# Patient Record
Sex: Male | Born: 1975 | Race: White | Hispanic: No | Marital: Married | Smoking: Never smoker
Health system: Southern US, Community
[De-identification: ages and names within clinical notes are randomized; demographics above are authoritative.]

---

## 2005-12-23 ENCOUNTER — Other Ambulatory Visit: Payer: Self-pay

## 2005-12-23 ENCOUNTER — Inpatient Hospital Stay: Payer: Self-pay | Admitting: Internal Medicine

## 2005-12-26 ENCOUNTER — Emergency Department (HOSPITAL_COMMUNITY): Admission: EM | Admit: 2005-12-26 | Discharge: 2005-12-26 | Payer: Self-pay | Admitting: Emergency Medicine

## 2010-10-20 ENCOUNTER — Other Ambulatory Visit: Payer: Self-pay | Admitting: Family Medicine

## 2010-10-20 ENCOUNTER — Ambulatory Visit
Admission: RE | Admit: 2010-10-20 | Discharge: 2010-10-20 | Payer: Self-pay | Source: Home / Self Care | Attending: Family Medicine | Admitting: Family Medicine

## 2010-10-20 DIAGNOSIS — M719 Bursopathy, unspecified: Secondary | ICD-10-CM

## 2010-10-20 DIAGNOSIS — M67919 Unspecified disorder of synovium and tendon, unspecified shoulder: Secondary | ICD-10-CM | POA: Insufficient documentation

## 2010-10-20 DIAGNOSIS — Z8679 Personal history of other diseases of the circulatory system: Secondary | ICD-10-CM | POA: Insufficient documentation

## 2010-10-20 LAB — BASIC METABOLIC PANEL
CO2: 30 mEq/L (ref 19–32)
Calcium: 9.6 mg/dL (ref 8.4–10.5)
Creatinine, Ser: 0.8 mg/dL (ref 0.4–1.5)
Sodium: 138 mEq/L (ref 135–145)

## 2010-10-20 LAB — LDL CHOLESTEROL, DIRECT: Direct LDL: 137.1 mg/dL

## 2010-10-30 NOTE — Assessment & Plan Note (Signed)
Summary: NEW PT TO BE ESTABLISHED/JRR   Vital Signs:  Patient profile:   35 year old male Height:      71 inches Weight:      261.8 pounds BMI:     36.65 Temp:     98.8 degrees F oral Pulse rate:   76 / minute Pulse rhythm:   regular BP sitting:   120 / 80  (left arm) Cuff size:   regular  Vitals Entered By: Benny Lennert CMA Duncan Dull) (October 20, 2010 10:51 AM)  History of Present Illness: Chief complaint new patient to be established also has right shoulder pain  35 year old male:  Right shoulder pain: The patient noted above presents with shoulder pain that has been ongoing for 1-2 months  there is no history of trauma or accident. The patient denies neck pain or radicular symptoms. Denies dislocation, subluxation, separation of the shoulder. The patient does complain of pain in the overhead plane, IROM and EROM  Medications Tried: NSAIDS Tried PT: No, but good knowledge and doing HEP RTC and scap stabilization at home with theraband.  Prior shoulder Injury: left only Prior surgery: No Prior fracture: No  Massage therapist, left therapist, saw Van Clines. Left shoulder about 10 years ago. any movement of the shoulder hurts  left ear is really clogged.   TIA, in 2007. Working two jobs. Had normal MRI.  Was only getting two or three hours a night.    Preventive Screening-Counseling & Management  Alcohol-Tobacco     Smoking Status: never  Caffeine-Diet-Exercise     Does Patient Exercise: yes      Drug Use:  no.    Allergies (verified): No Known Drug Allergies  Past History:  Past Medical History: TIA, 2007  Family History: none  Social History: Occupation:massage therapist Married, 4 children Never Smoked Alcohol use-no Drug use-no Regular exercise-yes Occupation:  employed Smoking Status:  never Drug Use:  no Does Patient Exercise:  yes  Review of Systems       REVIEW OF SYSTEMS  GEN: No systemic complaints, no fevers, chills, sweats,  or other acute illnesses MSK: Detailed in the HPI GI: tolerating PO intake without difficulty Neuro: No numbness, parasthesias, or tingling associated. Otherwise the pertinent positives of the ROS are noted above.    Physical Exam  General:  GEN: Well-developed,well-nourished,in no acute distress; alert,appropriate and cooperative throughout examination HEENT: Normocephalic and atraumatic without obvious abnormalities. No apparent alopecia or balding. Ears, externally no deformities PULM: Breathing comfortably in no respiratory distress EXT: No clubbing, cyanosis, or edema PSYCH: Normally interactive. Cooperative during the interview. Pleasant. Friendly and conversant. Not anxious or depressed appearing. Normal, full affect.  Msk:  Shoulder: R Inspection: No muscle wasting or winging Ecchymosis/edema: neg  ROM IROM restricted with minimal IROM at 90 deg abduction on the R compared to L with normal IROM AC joint, scapula, clavicle: NT Cervical spine: NT, full ROM Spurling's: neg Abduction: full, 5/5 Flexion: full, 5/5 IR, full, lift-off: 5/5 ER at neutral: full, 5/5 AC crossover: neg Neer: pos Hawkins: pos Drop Test: neg Empty Can: pos Supraspinatus insertion: mild-mod T Bicipital groove: NT Speed's: neg Yergason's: neg Sulcus sign: neg Scapular dyskinesis: none C5-T1 intact  Neuro: Sensation intact Grip 5/5    Impression & Recommendations:  Problem # 1:  ROTATOR CUFF SYNDROME, RIGHT (ICD-726.10) Assessment New  Shoulder anatomy was reviewed with the patient using and anatomical model.  GIRD: work on [employer], Press photographer cuff strengthening and scapular  stabilization exercises were reviewed with the patient.  A handout was given based on a shoulder program from [facility].  Retraining shoulder mechanics and function was emphasized to the patient with rehab done at least 5-6 days a week.  The patient could benefit from formal PT to assist with  scapular stabilization and RTC strengthening.   Orders: Joint Aspirate / Injection, Large (20610) Kenalog 10mg  (4units) (J3301)  Problem # 2:  TRANSIENT ISCHEMIC ATTACKS, HX OF (ICD-V12.50) Assessment: New lifelong ASA check lipids  Problem # 3:  SCREENING FOR LIPOID DISORDERS (ICD-V77.91)  Orders: Venipuncture (04540) TLB-Cholesterol, Direct LDL (83721-DIRLDL)  Problem # 4:  SCREENING, DIABETES MELLITUS (ICD-V77.1)  Orders: TLB-BMP (Basic Metabolic Panel-BMET) (80048-METABOL)  Complete Medication List: 1)  Baby Aspirin 81 Mg Chew (Aspirin) .... One tablet daily   Orders Added: 1)  Venipuncture [36415] 2)  TLB-Cholesterol, Direct LDL [83721-DIRLDL] 3)  TLB-BMP (Basic Metabolic Panel-BMET) [80048-METABOL] 4)  New Patient Level III [98119] 5)  Joint Aspirate / Injection, Large [20610] 6)  Kenalog 10mg  (4units) [J3301]    Current Allergies (reviewed today): No known allergies

## 2011-03-29 ENCOUNTER — Ambulatory Visit (INDEPENDENT_AMBULATORY_CARE_PROVIDER_SITE_OTHER): Payer: Self-pay

## 2011-03-29 ENCOUNTER — Inpatient Hospital Stay (INDEPENDENT_AMBULATORY_CARE_PROVIDER_SITE_OTHER)
Admission: RE | Admit: 2011-03-29 | Discharge: 2011-03-29 | Disposition: A | Discharge status: Home / Self Care | Payer: Self-pay | Source: Ambulatory Visit | Attending: Emergency Medicine | Admitting: Emergency Medicine

## 2011-03-29 DIAGNOSIS — S335XXA Sprain of ligaments of lumbar spine, initial encounter: Secondary | ICD-10-CM

## 2011-03-29 LAB — POCT URINALYSIS DIP (DEVICE)
Bilirubin Urine: NEGATIVE
Glucose, UA: NEGATIVE mg/dL
Ketones, ur: NEGATIVE mg/dL
Leukocytes, UA: NEGATIVE
Nitrite: NEGATIVE
pH: 5 (ref 5.0–8.0)

## 2012-04-15 ENCOUNTER — Telehealth: Payer: Self-pay | Admitting: Family Medicine

## 2012-04-15 NOTE — Telephone Encounter (Signed)
Triage Record Num: [id number] Operator: Griselda Miner Patient Name: John Valenzuela Call Date & Time: 04/14/2012 8:58:19PM Patient Phone: [phone] PCP: Hannah Beat Patient Gender: Male PCP Fax : [phone] Patient DOB: 1976/01/01 Practice Name: [facility] Reason for Call: Caller: Nicoles/Patient; PCP: Juleen China.; CB#: [phone]; Call regarding Sore Throat; Onset today and has fever of 102 orally 20:30-has taken fever reducer and does bring time down; Is able to eat and drink; Children were dx strep throat on Monday 7/14 and 7/16; Triaged using Sore Throat or Hoarseness with a disposition to see provider within 24 hrs r/t temperature of 101.5 or greater. Offered to triage wife who was having the same symptoms but declined stating she will need to be seen as well. limited care advice given. Pt had hoped for Abx to be called in. RN explained the need for evaluations r/t throat. Pt demonstrated understanding. Protocol(s) Used: Sore Throat or Hoarseness Recommended Outcome per Protocol: See Provider within 24 hours Reason for Outcome: Temperature of 101.5 F(38.6 C) or greater Care Advice: Analgesic/Antipyretic Advice - Acetaminophen: Consider acetaminophen as directed on label or by pharmacist/provider for pain or fever PRECAUTIONS: - Use if there is no history of liver disease, alcoholism, or intake of three or more alcohol drinks per day - Only if approved by provider during pregnancy or when breastfeeding - During pregnancy, acetaminophen should not be taken more than 3 consecutive days without telling provider - Do not exceed recommended dose or frequency ~ Analgesic/Antipyretic Advice - NSAIDs: Consider aspirin, ibuprofen, naproxen or ketoprofen for pain or fever as directed on label or by pharmacist/provider. PRECAUTIONS: - If over 66 years of age, should not take longer than 1 week without consulting provider. EXCEPTIONS: - Should not be used if taking  blood thinners or have bleeding problems. - Do not use if have history of sensitivity/allergy to any of these medications; or history of cardiovascular, ulcer, kidney, liver disease or diabetes unless approved by provider. - Do not exceed recommended dose or frequency. ~ 04/14/2012 9:09:16PM Page 1 of 1 [email or web address]

## 2013-07-12 ENCOUNTER — Emergency Department (INDEPENDENT_AMBULATORY_CARE_PROVIDER_SITE_OTHER)
Admission: EM | Admit: 2013-07-12 | Discharge: 2013-07-12 | Disposition: A | Discharge status: Home / Self Care | Payer: Self-pay | Source: Home / Self Care | Attending: Family Medicine | Admitting: Family Medicine

## 2013-07-12 ENCOUNTER — Encounter (HOSPITAL_COMMUNITY): Payer: Self-pay | Admitting: Emergency Medicine

## 2013-07-12 DIAGNOSIS — M545 Low back pain: Secondary | ICD-10-CM

## 2013-07-12 MED ORDER — HYDROCODONE-ACETAMINOPHEN 5-325 MG PO TABS: 1.0000 | ORAL_TABLET | Freq: Four times a day (QID) | ORAL | Status: DC | PRN

## 2013-07-12 MED ORDER — KETOROLAC TROMETHAMINE 60 MG/2ML IM SOLN
60.0000 mg | Freq: Once | INTRAMUSCULAR | Status: AC
  Administered 2013-07-12: 60 mg via INTRAMUSCULAR

## 2013-07-12 MED ORDER — MELOXICAM 15 MG PO TABS: 15.0000 mg | ORAL_TABLET | Freq: Every day | ORAL | Status: DC | PRN

## 2013-07-12 MED ORDER — METHYLPREDNISOLONE ACETATE 80 MG/ML IJ SUSP
INTRAMUSCULAR | Status: AC
  Filled 2013-07-12: qty 1

## 2013-07-12 MED ORDER — METHOCARBAMOL 500 MG PO TABS: 500.0000 mg | ORAL_TABLET | Freq: Four times a day (QID) | ORAL | Status: DC | PRN

## 2013-07-12 MED ORDER — KETOROLAC TROMETHAMINE 60 MG/2ML IM SOLN
INTRAMUSCULAR | Status: AC
  Filled 2013-07-12: qty 2

## 2013-07-12 MED ORDER — METHYLPREDNISOLONE ACETATE 80 MG/ML IJ SUSP
80.0000 mg | Freq: Once | INTRAMUSCULAR | Status: AC
  Administered 2013-07-12: 80 mg via INTRAMUSCULAR

## 2013-07-12 NOTE — ED Notes (Signed)
Recurrent pain right lower back ; had been told by another provider that there is a defect in his spine; denies trauma or saddle anesthesia or loss of sensation in his leg

## 2013-07-12 NOTE — ED Notes (Signed)
Discussed medication compliance and precautions

## 2013-07-12 NOTE — ED Provider Notes (Signed)
John Valenzuela is a 37 y.o. male who presents to Urgent Care today for bilateral low back pain present for several days. No radiating pain weakness numbness bowel bladder dysfunction difficulty walking fevers or chills. No medications tried. He feels well otherwise. He has had several episodes of low back pain similar to this the past. They have all resolved with oral medications after several days.    History reviewed. No pertinent past medical history. History  Substance Use Topics  . Smoking status: Never Smoker   . Smokeless tobacco: Not on file  . Alcohol Use: No   ROS as above Medications reviewed. Current Facility-Administered Medications  Medication Dose Route Frequency Provider Last Rate Last Dose  . ketorolac (TORADOL) injection 60 mg  60 mg Intramuscular Once Rodolph Bong, MD      . methylPREDNISolone acetate (DEPO-MEDROL) injection 80 mg  80 mg Intramuscular Once Rodolph Bong, MD       Current Outpatient Prescriptions  Medication Sig Dispense Refill  . HYDROcodone-acetaminophen (NORCO/VICODIN) 5-325 MG per tablet Take 1 tablet by mouth every 6 (six) hours as needed for pain.  15 tablet  0  . meloxicam (MOBIC) 15 MG tablet Take 1 tablet (15 mg total) by mouth daily as needed for pain.  30 tablet  0  . methocarbamol (ROBAXIN) 500 MG tablet Take 1 tablet (500 mg total) by mouth 4 (four) times daily as needed (spasm).  30 tablet  0    Exam:  BP 143/88  Pulse 91  Temp(Src) 97.9 F (36.6 C) (Oral)  Resp 18  SpO2 95% Gen: Well NAD BACK: Nontender to spinal midline. Tender palpation bilateral SI joints. Negative straight leg raise test and Faber test bilaterally. Range of motion is decreased with flexion, normal extension and rotation Strength sensation are intact bilateral lower extremity Patient is able to stand on heels toes squat and get on and off exam table by himself Reflexes are intact and equal bilateral knees and ankle Capillary refill is intact distal bilateral lower  extremities.   No results found for this or any previous visit (from the past 24 hour(s)). No results found.  Assessment and Plan: 37 y.o. male with lumbago Plan to treat with meloxicam, Robaxin, Norco Followup with Dr. Katrinka Blazing and Dr. Dallas Schimke. Discussed warning signs or symptoms. Please see discharge instructions. Patient expresses understanding.      Rodolph Bong, MD 07/12/13 1020

## 2017-11-23 ENCOUNTER — Encounter: Payer: Self-pay | Admitting: Emergency Medicine

## 2017-11-23 ENCOUNTER — Ambulatory Visit: Payer: Self-pay | Admitting: Emergency Medicine

## 2017-11-23 VITALS — BP 120/90 | HR 97 | Temp 98.6°F | Wt 272.2 lb

## 2017-11-23 DIAGNOSIS — J0100 Acute maxillary sinusitis, unspecified: Secondary | ICD-10-CM

## 2017-11-23 MED ORDER — AMOXICILLIN-POT CLAVULANATE 875-125 MG PO TABS: 1.0000 | ORAL_TABLET | Freq: Two times a day (BID) | ORAL | 0 refills | Status: DC

## 2017-11-23 MED ORDER — PREDNISONE 50 MG PO TABS: ORAL_TABLET | ORAL | 0 refills | Status: DC

## 2017-11-23 NOTE — Progress Notes (Signed)
Subjective:     John Valenzuela is a 42 y.o. male who presents for evaluation of sinus pain. Symptoms include: congestion, facial pain, post nasal drip and snoring. Onset of symptoms was 10 days ago. Symptoms have been unchanged since that time. Past history is significant for influenza 2 weeks ago. Patient is a non-smoker.   Review of Systems Pertinent items noted in HPI and remainder of comprehensive ROS otherwise negative.   Objective:    BP 120/90   Pulse 97   Temp 98.6 F (37 C)   Wt 272 lb 3.2 oz (123.5 kg)   SpO2 96%   BMI 37.96 kg/m  General appearance: alert, cooperative and appears stated age Head: Normocephalic, without obvious abnormality, atraumatic Ears: normal TM's and external ear canals both ears Nose: sinus tenderness bilateral Throat: lips, mucosa, and tongue normal; teeth and gums normal Lungs: clear to auscultation bilaterally Heart: regular rate and rhythm Pulses: 2+ and symmetric    Assessment:    Acute bacterial sinusitis.    Plan:    Nasal saline sprays. Augmentin per medication orders. Follow up in 1 week or as needed.   D/C OTC Afrin

## 2017-11-23 NOTE — Patient Instructions (Signed)

## 2018-06-13 ENCOUNTER — Ambulatory Visit: Payer: Self-pay | Admitting: Physician Assistant

## 2018-06-13 VITALS — BP 122/82 | HR 79 | Temp 98.2°F | Resp 20 | Wt 250.4 lb

## 2018-06-13 DIAGNOSIS — M778 Other enthesopathies, not elsewhere classified: Secondary | ICD-10-CM

## 2018-06-13 DIAGNOSIS — M779 Enthesopathy, unspecified: Secondary | ICD-10-CM

## 2018-06-13 MED ORDER — PREDNISONE 10 MG (21) PO TBPK: ORAL_TABLET | Freq: Every day | ORAL | 0 refills | Status: AC

## 2018-06-13 NOTE — Patient Instructions (Signed)
Tenosynovitis Suspected diagnosis of tendonitis of right hand. Take prednisone taper as prescribed. 60mg  on first day, 50mg  on second day, 40mg  on third day, 30mg  on fourth day, 20mg  on fifth day, and 10mg  of sixth day. Take with food to prevent stomach upset. May use over the counter Pepcid or Zantac if you develop stomach upset. May apply ice and/or heat. May use over the counter [place], IcyHot, or BioFreeze. If limited improvement in right hand pain within the next week, recommend patient follow-up with orthopedist. Such as [facility]. [address], [place], [place] [address]. [phone].  Tenosynovitis is inflammation of a tendon and the sleeve of tissue that covers the tendon (tendon sheath). A tendon is a cord of tissue that connects muscle to bone. Normally, a tendon slides smoothly inside its tendon sheath. Tenosynovitis limits movement of the tendon and surrounding tissues, which may cause pain and stiffness. Tenosynovitis can affect any tendon and tendon sheath. Commonly affected areas include tendons in the:  Shoulder.  Arm.  Hand.  Hip.  Leg.  Foot.  What are the causes? The main cause of this condition is wear and tear over time that results in slight tears in the tendon. Other possible causes include:  A sudden injury to the tendon or tendon sheath.  A disease that causes inflammation in the body.  An infection that spreads to the tendon and tendon sheath from a skin wound.  An infection in another part of the body that spreads to the tendon and tendon sheath through the blood.  What increases the risk? The following factors may make you more likely to develop this condition:  Having rheumatoid arthritis, gout, or diabetes.  Using IV drugs.  Doing physical activities that can cause tendon overuse and stress.  Having gonorrhea.  What are the signs or symptoms? Symptoms of this condition depend on the cause. Symptoms may include:  Pain  with movement.  Pain and tenderness when pressing on the tendon and tendon sheath.  Swelling.  Stiffness.  If tenosynovitis is caused by an infection, symptoms may also include:  Fever.  Redness.  Warmth.  How is this diagnosed? This condition may be diagnosed based on your medical history and a physical exam. You also may have:  Blood tests.  Imaging tests, such as: ? MRI. ? Ultrasound.  A sample of fluid removed from inside the tendon sheath to be checked in a lab.  How is this treated? Treatment for this condition depends on the cause. If tenosynovitis is not caused by an infection, treatment may include:  Resting the tendon.  Keeping the tendon in place for periods of time (immobilization) in a splint, brace, or sling.  NSAIDs to reduce pain and swelling.  A shot (injection) of medicine to help reduce pain and swelling (steroid).  Icing or applying heat to the affected area.  Physical therapy.  Surgery to release the tendon in the sheath or to repair damage to the tendon or tendon sheath. Surgery may be done if other treatments do not help relieve symptoms.  If tenosynovitis is caused by infection, treatment may include antibiotic medicine given through an IV. In some cases, surgery may be needed to drain fluid from the tendon sheath or to remove the tendon sheath. Follow these instructions at home: If you have a splint, brace, or sling:   Wear the splint, brace, or sling as told by your health care provider. Remove it only as told by your health care provider.  Loosen the splint, brace, or sling if your fingers or toes tingle, become numb, or turn cold and blue.  Do not let your splint or brace get wet if it is not waterproof. ? Do not take baths, swim, or use a hot tub until your health care provider approves. Ask your health care provider if you can take showers. ? If your splint, brace, or sling is not waterproof, cover it with a watertight covering when  you take a bath or a shower.  Keep the splint, brace, or sling clean. Managing pain, stiffness, and swelling  If directed, apply ice to the affected area. ? Put ice in a plastic bag. ? Place a towel between your skin and the bag. ? Leave the ice on for 20 minutes, 2-3 times a day.  Move the fingers or toes of the affected limb often, if this applies. This can help to prevent stiffness and lessen swelling.  If directed, raise (elevate) the affected area above the level of your heart while you are sitting or lying down.  If directed, apply heat to the affected area as often as told by your health care provider. Use the heat source that your health care provider recommends, such as a moist heat pack or a heating pad. ? Place a towel between your skin and the heat source. ? Leave the heat on for 20-30 minutes. ? Remove the heat if your skin turns bright red. This is especially important if you are unable to feel pain, heat, or cold. You may have a greater risk of getting burned. Driving  Do not drive or operate heavy machinery while taking prescription pain medicine.  Ask your health care provider when it is safe to drive if you have a splint or brace on any part of your arm or leg. Activity  Return to your normal activities as told by your health care provider. Ask your health care provider what activities are safe for you.  Rest the affected area as told by your health care provider.  Avoid using the affected area while you are having symptoms of tenosynovitis.  If physical therapy was prescribed, do exercises as told by your health care provider. Safety  Do not use the injured limb to support your body weight until your health care provider says that you can. General instructions  Take over-the-counter and prescription medicines only as told by your health care provider.  Keep all follow-up visits as told by your health care provider. This is important. Contact a health care  provider if:  Your symptoms are not improving or are getting worse.  You have a fever and more of any of the following symptoms: ? Pain. ? Redness. ? Warmth. ? Swelling. Get help right away if:  Your fingers or toes become numb or turn blue. This information is not intended to replace advice given to you by your health care provider. Make sure you discuss any questions you have with your health care provider. Document Released: 09/14/2005 Document Revised: 05/14/2016 Document Reviewed: 07/24/2015 Elsevier Interactive Patient Education  [employer].

## 2018-06-13 NOTE — Progress Notes (Signed)
Subjective:    John Valenzuela is a 42 y.o. male who presents for evaluation of right hand pain. Located at 3rd MCP. Primarily dorsal aspect. Associated edema. Denies erythema or ecchymosis. No known trauma or injury. No recent increase in activity; such as exercise regimen or work. Patient works as a Teacher, adult educationmassage therapist, uses hands frequently. Patient is right hand dominant. Mild radiation of pain along radial aspect of 3rd proximal phalanx. No crepitus. Denies popping, crackling, grinding. Denies hand weakness or paresthesias. Denies previous history of similar symptoms. No previous history of Gout. No family history of Gout. No personal history of arthritis. Has been taking over the counter Advil and applying ice with no improvement. Patient denies associated fever, chills, headache, bodyaches, nausea/vomiting, rash.  Review of Systems Pertinent items noted in HPI and remainder of comprehensive ROS otherwise negative.    Objective:    BP 122/82 (BP Location: Right Arm, Patient Position: Sitting, Cuff Size: Normal)   Pulse 79   Temp 98.2 F (36.8 C) (Oral)   Resp 20   Wt 250 lb 6.4 oz (113.6 kg)   SpO2 98%   BMI 34.92 kg/m   Patient sitting comfortably on examination table. In no acute distress. Afebrile.  Patient's left hand normal to inspection. No edema, erythema, or ecchymosis. No gross deformity. No tenderness with palpation. Full ROM of fingers. 5/5 motor strength. Strong radial pulse. Sensation intact. Brisk capillary refill.  Patient's right hand inspection reveals mild edema at dorsal aspect of 3rd MCP. Extends to finger webbing of 2nd-3rd and 3rd-4th MCP. Minimal edema at palmar aspect of 3rd MCP. No erythema. No streaking redness. No ecchymosis. Mild tenderness with palpation. No palpable crepitus. No palpable cyst. Full ROM. Tightness with full flexion at 3rd MCP. 5/5 motor strength; pain with extension against resistance. Sensation intact. Brisk capillary refill.  Imaging: None  performed.  Assessment:    Hand sprain / Right hand tendonitis    Plan:    1. Right hand tendonitis - predniSONE (STERAPRED UNI-PAK 21 TAB) 10 MG (21) TBPK tablet; Take by mouth daily for 6 days. Take as directed  Dispense: 21 tablet; Refill: 0  Patient with localized edema at dorsal aspect of right 3rd MCP. Patient with job requiring frequent use of hands. Patient with exercise regiment requiring gripping of kettle ball with right hand. Patient with pain with extension against resistance of 3rd/middle finger. Suspect extensor tendon tendonitis of 3rd/middle finger.  Minimal improvement with rest, ice and OTC NSAIDs (Advil). Prescribed tapering course of prednisone. With advised ortho follow-up in one week if not improving.

## 2018-06-15 ENCOUNTER — Telehealth: Payer: Self-pay

## 2018-06-15 NOTE — Telephone Encounter (Signed)
Patient phone number on chart is an office number.

## 2021-05-13 NOTE — Progress Notes (Signed)
Subjective:    CC: B hand and elbow pain  I, John Valenzuela, LAT, ATC, am serving as scribe for Dr. Clementeen Graham.  HPI: Pt is a 45 y/o male presenting w/ B hand and elbow pain ongoing for about a year, but has worsened over the past 6 months.  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 Pt has had to cut back his work schedule to accommodate. Pt works out regularly. Pt notes his hands are worst first thing in the mornings.  He locates his pain specifically to all over hands and fingers and middles aspect of B elbows.   Swelling: no Numbness/tingling: yes- fingers 3-5 Aggravating factors: gripping Treatments tried: chiro, exercise, naproxen  Pertinent review of Systems: no fever or chills  Relevant historical information: HX TIA   Objective:    Vitals:   05/14/21 0810  BP: (!) 142/92  Pulse: 81  SpO2: 98%   General: Well Developed, well nourished, and in no acute distress.   MSK: C-spine: Normal cervical motion. BL UE strength is intact.  Elbow R: Normal appearing. Normal motion  Normal strength. Nontender. Negative Tinel's cubital tunnel. Pain with resisted pronation present at medial epicondyle. No pain with resisted wrist flexion or grip or wrist extension.  Left elbow normal.  Normal motion normal strength. Nontender. Negative Tinel's cubital tunnel. Pain with resisted pronation present at medial epicondyle.  No pain with resisted wrist flexion grip or wrist extension.  Right hand normal-appearing Nontender. Positive Tinel's carpal tunnel and minimally positive Tinel's Guyon's canal. Strength is intact. Pulses cap refill and sensation are intact distally  Left hand normal appearing Nontender. Positive Tinel's carpal tunnel. Strength is intact. Pulses capillary refill and sensation are intact distally.  Lab and Radiology Results  X-ray images bilateral hands obtained today personally and independently  interpreted  Right hand: No significant degenerative changes.  No erosions present.  No acute fractures.  Left hand: No significant degenerative changes.  No erosions present.  No acute fractures.  Await formal radiology review    Impression and Recommendations:    Assessment and Plan: 45 y.o. male with bilateral hand pain and paresthesias.  Paresthesias are predominantly located to digits 3 4 and 5.  Patient also notes stiffness and soreness into his hands especially in the morning and pain in the medial elbow bilaterally.  He works as a Teacher, adult education.  The symptoms are bothersome enough that he is struggling a bit at work.  He is tried significant conservative management already with extensive home exercise program, treat with chiropractor, and massage therapy.  Differential includes carpal tunnel syndrome and ulnar nerve irritation probably at Guyon's canal.  Patient may also have a component of pronator syndrome as well.  Cervical radiculopathy is less likely.  Rheumatologic cause is less likely but not impossible as well.  As symptoms have been longstanding and ongoing plan for nerve conduction study bilaterally to evaluate for severity and degree of peripheral nerve irritation and injury.  Additionally refer to hand therapy and recheck after nerve conduction study results are back.  Limited trial of gabapentin.Marland Kitchen  PDMP not reviewed this encounter. Orders Placed This Encounter  Procedures   DG Hand Complete Right    Standing Status:   Future    Number of Occurrences:   1    Standing Expiration Date:   05/14/2022    Order Specific Question:   Reason for Exam (SYMPTOM  OR DIAGNOSIS  REQUIRED)    Answer:   eval hand pain    Order Specific Question:   Preferred imaging location?    Answer:   [facility]   DG Hand Complete Left    Standing Status:   Future    Number of Occurrences:   1    Standing Expiration Date:   05/14/2022    Order Specific Question:   Reason for Exam  (SYMPTOM  OR DIAGNOSIS REQUIRED)    Answer:   eval hand pain    Order Specific Question:   Preferred imaging location?    Answer:   [facility]   Ambulatory referral to Neurology    Referral Priority:   Routine    Referral Type:   Consultation    Referral Reason:   Specialty Services Required    Requested Specialty:   Neurology    Number of Visits Requested:   1   Ambulatory referral to Physical Therapy    Referral Priority:   Routine    Referral Type:   Physical Medicine    Referral Reason:   Specialty Services Required    Requested Specialty:   Physical Therapy    Number of Visits Requested:   1   NCV with EMG(electromyography)    Standing Status:   Future    Standing Expiration Date:   05/14/2022    Order Specific Question:   Where should this test be performed?    Answer:   [facility]   Meds ordered this encounter  Medications   gabapentin (NEURONTIN) 300 MG capsule    Sig: Take 1 capsule (300 mg total) by mouth 3 (three) times daily as needed.    Dispense:  90 capsule    Refill:  3    Discussed warning signs or symptoms. Please see discharge instructions. Patient expresses understanding.   The above documentation has been reviewed and is accurate and complete Clementeen Graham, M.D.

## 2021-05-14 ENCOUNTER — Ambulatory Visit (INDEPENDENT_AMBULATORY_CARE_PROVIDER_SITE_OTHER): Payer: No Typology Code available for payment source

## 2021-05-14 ENCOUNTER — Other Ambulatory Visit: Payer: Self-pay

## 2021-05-14 ENCOUNTER — Ambulatory Visit (INDEPENDENT_AMBULATORY_CARE_PROVIDER_SITE_OTHER): Payer: No Typology Code available for payment source | Admitting: Family Medicine

## 2021-05-14 VITALS — BP 142/92 | HR 81 | Ht 71.0 in | Wt 256.0 lb

## 2021-05-14 DIAGNOSIS — M79602 Pain in left arm: Secondary | ICD-10-CM

## 2021-05-14 DIAGNOSIS — R202 Paresthesia of skin: Secondary | ICD-10-CM

## 2021-05-14 DIAGNOSIS — M79601 Pain in right arm: Secondary | ICD-10-CM | POA: Diagnosis not present

## 2021-05-14 MED ORDER — GABAPENTIN 300 MG PO CAPS
300.0000 mg | ORAL_CAPSULE | Freq: Three times a day (TID) | ORAL | 3 refills | Status: DC | PRN
Start: 2021-05-14 — End: 2022-02-06

## 2021-05-14 NOTE — Patient Instructions (Addendum)
Thank you for coming in today.   I've referred you to Physical Therapy.  Let us know if you don't hear from them in one week.   Plan for nerve study.   Please get an Xray today before you leave   Recheck after the nerve study is back.   Try gabapentin up to 3x dialy. Mostly take it at bedtime. It can make you sleepy.   Use carpal tunnel wrist braces at night.

## 2021-05-15 NOTE — Progress Notes (Signed)
Right hand x-ray looks normal to radiology

## 2021-05-15 NOTE — Progress Notes (Signed)
Left hand x-ray looks normal to radiology.

## 2021-05-19 ENCOUNTER — Encounter: Payer: Self-pay | Admitting: Neurology

## 2021-06-17 ENCOUNTER — Ambulatory Visit: Payer: No Typology Code available for payment source | Admitting: Neurology

## 2021-06-17 ENCOUNTER — Other Ambulatory Visit: Payer: Self-pay

## 2021-06-17 DIAGNOSIS — M79602 Pain in left arm: Secondary | ICD-10-CM | POA: Diagnosis not present

## 2021-06-17 DIAGNOSIS — M79601 Pain in right arm: Secondary | ICD-10-CM

## 2021-06-17 DIAGNOSIS — G5601 Carpal tunnel syndrome, right upper limb: Secondary | ICD-10-CM

## 2021-06-17 DIAGNOSIS — R202 Paresthesia of skin: Secondary | ICD-10-CM | POA: Diagnosis not present

## 2021-06-17 NOTE — Procedures (Signed)
[facility]  [address] [place], [address]  [place], [place] [address] Tel: [phone] Fax:  [phone] Test Date:  06/17/2021  Patient: John Valenzuela DOB: [birth year] Physician: Nita Sickle, DO  Sex: Male Height: 5\' 11"  Ref Phys: , M.D.  ID#: Clementeen Graham   Technician:    Patient Complaints: This is a 45 year old man referred for evaluation of right hand paresthesias.  NCV & EMG Findings: Extensive electrodiagnostic testing of the right upper extremity and additional studies of the left shows:  Right median sensory response shows latency (3.9 ms) and reduced amplitude (17.6 V).  Left median, left mixed palmar, bilateral ulnar, and bilateral dorsal ulnar cutaneous sensory responses are within normal limits. Right median motor response shows prolonged latency (4.2 ms).  Left median and bilateral ulnar motor responses are within normal limits.   There is no evidence of active or chronic motor axonal loss changes affecting any of the tested muscles.  Motor unit configuration and recruitment pattern is within normal limits.    Impression: Right median neuropathy at or distal to the wrist, consistent with a clinical diagnosis of carpal tunnel syndrome.  Overall, these findings are moderate in degree electrically. There is no evidence of an ulnar neuropathy or cervical radiculopathy affecting the upper extremities.   ___________________________ 57, DO    Nerve Conduction Studies Anti Sensory Summary Table   Stim Site NR Peak (ms) Norm Peak (ms) P-T Amp (V) Norm P-T Amp  Left DorsCutan Anti Sensory (Dorsum 5th MC)  34C  Wrist    1.8 <3.1 16.5 >12  Right DorsCutan Anti Sensory (Dorsum 5th MC)  34C  Wrist    1.7 <3.1 19.7 >12  Left Median Anti Sensory (2nd Digit)  34C  Wrist    3.1 <3.4 34.3 >20  Right Median Anti Sensory (2nd Digit)  34C  Wrist    3.9 <3.4 17.6 >20  Left Ulnar Anti Sensory (5th Digit)  34C  Wrist    2.7 <3.1 34.6 >12  Right Ulnar  Anti Sensory (5th Digit)  34C  Wrist    2.6 <3.1 30.8 >12   Motor Summary Table   Stim Site NR Onset (ms) Norm Onset (ms) O-P Amp (mV) Norm O-P Amp Site1 Site2 Delta-0 (ms) Dist (cm) Vel (m/s) Norm Vel (m/s)  Left Median Motor (Abd Poll Brev)  34C  Wrist    3.3 <3.9 14.7 >6 Elbow Wrist 4.6 28.0 61 >50  Elbow    7.9  13.7         Right Median Motor (Abd Poll Brev)  34C  Wrist    4.2 <3.9 8.9 >6 Elbow Wrist 4.8 28.0 58 >50  Elbow    9.0  8.2         Left Ulnar Motor (Abd Dig Minimi)  34C  Wrist    2.3 <3.1 9.6 >7 B Elbow Wrist 3.8 23.0 61 >50  B Elbow    6.1  9.6  A Elbow B Elbow 1.5 10.0 67 >50  A Elbow    7.6  9.6         Right Ulnar Motor (Abd Dig Minimi)  34C  Wrist    1.8 <3.1 9.2 >7 B Elbow Wrist 3.6 23.0 64 >50  B Elbow    5.4  9.1  A Elbow B Elbow 1.7 10.0 59 >50  A Elbow    7.1  9.1         Right Ulnar (FDI) Motor (1st DI)  34C  Wrist    3.4 <4.3 11.0 >7 B Elbow Wrist 3.9 23.0 59 >50  B Elbow    7.3  10.0  A Elbow B Elbow 1.9 10.0 53 >50  A Elbow    9.2  8.8          Comparison Summary Table   Stim Site NR Peak (ms) Norm Peak (ms) P-T Amp (V) Site1 Site2 Delta-P (ms) Norm Delta (ms)  Left Median/Ulnar Palm Comparison (Wrist - 8cm)  34C  Median Palm    1.8 <2.2 54.5 Median Palm Ulnar Palm 0.2   Ulnar Palm    1.6 <2.2 17.5       EMG   Side Muscle Ins Act Fibs Psw Fasc Number Recrt Dur Dur. Amp Amp. Poly Poly. Comment  Right 1stDorInt Nml Nml Nml Nml Nml Nml Nml Nml Nml Nml Nml Nml N/A  Right Abd Poll Brev Nml Nml Nml Nml Nml Nml Nml Nml Nml Nml Nml Nml N/A  Right PronatorTeres Nml Nml Nml Nml Nml Nml Nml Nml Nml Nml Nml Nml N/A  Right Triceps Nml Nml Nml Nml Nml Nml Nml Nml Nml Nml Nml Nml N/A  Right Biceps Nml Nml Nml Nml Nml Nml Nml Nml Nml Nml Nml Nml N/A  Right Deltoid Nml Nml Nml Nml Nml Nml Nml Nml Nml Nml Nml Nml N/A  Left 1stDorInt Nml Nml Nml Nml Nml Nml Nml Nml Nml Nml Nml Nml N/A  Left PronatorTeres Nml Nml Nml Nml Nml Nml Nml Nml Nml Nml Nml Nml N/A   Left Biceps Nml Nml Nml Nml Nml Nml Nml Nml Nml Nml Nml Nml N/A  Left Triceps Nml Nml Nml Nml Nml Nml Nml Nml Nml Nml Nml Nml N/A  Left Deltoid Nml Nml Nml Nml Nml Nml Nml Nml Nml Nml Nml Nml N/A      Waveforms:

## 2021-06-18 NOTE — Progress Notes (Signed)
Nerve conduction study shows medium carpal tunnel syndrome.  Its reasonable to try an injection. If not better after an injection surgery makes sense.   Recommend return to clinic to discuss the nerve conduction study and proceed to injection if it makes sense.

## 2021-06-19 ENCOUNTER — Encounter: Payer: Self-pay | Admitting: Family Medicine

## 2021-06-19 ENCOUNTER — Ambulatory Visit: Payer: Self-pay

## 2021-06-19 ENCOUNTER — Ambulatory Visit (INDEPENDENT_AMBULATORY_CARE_PROVIDER_SITE_OTHER): Payer: No Typology Code available for payment source | Admitting: Family Medicine

## 2021-06-19 ENCOUNTER — Other Ambulatory Visit: Payer: Self-pay

## 2021-06-19 VITALS — BP 130/84 | HR 82 | Ht 71.0 in | Wt 258.4 lb

## 2021-06-19 DIAGNOSIS — M79601 Pain in right arm: Secondary | ICD-10-CM

## 2021-06-19 DIAGNOSIS — G5631 Lesion of radial nerve, right upper limb: Secondary | ICD-10-CM

## 2021-06-19 DIAGNOSIS — M79641 Pain in right hand: Secondary | ICD-10-CM | POA: Diagnosis not present

## 2021-06-19 DIAGNOSIS — R202 Paresthesia of skin: Secondary | ICD-10-CM

## 2021-06-19 DIAGNOSIS — M79602 Pain in left arm: Secondary | ICD-10-CM

## 2021-06-19 MED ORDER — NITROGLYCERIN 0.2 MG/HR TD PT24: MEDICATED_PATCH | TRANSDERMAL | 1 refills | Status: DC

## 2021-06-19 NOTE — Progress Notes (Signed)
I, Christoper Fabian, LAT, ATC, am serving as scribe for Dr. Clementeen Graham.  John Valenzuela is a 45 y.o. male who presents to [facility] at [facility] today for f/u of B hand and elbow pain and hand paresthesias (fingers 3-5)and to review his NCV/EMG results.  He was last seen by Dr. Denyse Amass on 05/14/21 and was prescribed Gabapentin and referred to neurology for a NCV/EMG.  Today, pt reports no change in his symptoms.  He has completed 3-4 PT sessions w/ no change in his symptoms.  He has been taking the Gabapentin and notices a slight improvement in his R hand paresthesias but not much.  His dominant right sided symptom is right lateral forearm pain.  He does experience paresthesias to the third fourth and fifth digits right hand palmar aspect.  He suspects radial tunnel syndrome  He notes left medial elbow pain.  Diagnostic testing: B UE NCV/EMG- 06/17/21; B hand XR- 05/14/21  Pertinent review of systems: No fevers or chills  Relevant historical information: Massage therapist.   Exam:  BP 130/84 (BP Location: Left Arm, Patient Position: Sitting, Cuff Size: Large)   Pulse 82   Ht 5\' 11"  (1.803 m)   Wt 258 lb 6.4 oz (117.2 kg)   SpO2 96%   BMI 36.04 kg/m  General: Well Developed, well nourished, and in no acute distress.   MSK:  Right elbow normal-appearing tender palpation right forearm approximately 3 to 4 cm distal to lateral epicondyle. Mildly tender palpation at lateral epicondyle. Extensor strength is intact wrist and fingers.       Lab and Radiology Results No results found for this or any previous visit (from the past 72 hour(s)). NCV with EMG(electromyography)  Result Date: 06/17/2021 06/19/2021, DO     06/17/2021  2:31 PM [facility] [address] [place], [address]  [place], [place] Tel: [phone] Fax:  [phone] Test Date:  06/17/2021 Patient: Carles Florea DOB: [birth year] Physician: [birth year]/24/1976, DO Sex: Male Height: 5\' 11"  Ref  Phys: Nita Sickle, M.D. ID#:   Technician:  Patient Complaints: This is a 45 year old man referred for evaluation of right hand paresthesias. NCV & EMG Findings: Extensive electrodiagnostic testing of the right upper extremity and additional studies of the left shows: Right median sensory response shows latency (3.9 ms) and reduced amplitude (17.6 V).  Left median, left mixed palmar, bilateral ulnar, and bilateral dorsal ulnar cutaneous sensory responses are within normal limits. Right median motor response shows prolonged latency (4.2 ms).  Left median and bilateral ulnar motor responses are within normal limits.  There is no evidence of active or chronic motor axonal loss changes affecting any of the tested muscles.  Motor unit configuration and recruitment pattern is within normal limits.  Impression: Right median neuropathy at or distal to the wrist, consistent with a clinical diagnosis of carpal tunnel syndrome.  Overall, these findings are moderate in degree electrically. There is no evidence of an ulnar neuropathy or cervical radiculopathy affecting the upper extremities. ___________________________ [id number], DO Nerve Conduction Studies Anti Sensory Summary Table  Stim Site NR Peak (ms) Norm Peak (ms) P-T Amp (V) Norm P-T Amp Left DorsCutan Anti Sensory (Dorsum 5th MC)  34C Wrist    1.8 <3.1 16.5 >12 Right DorsCutan Anti Sensory (Dorsum 5th MC)  34C Wrist    1.7 <3.1 19.7 >12 Left Median Anti Sensory (2nd Digit)  34C Wrist    3.1 <3.4 34.3 >20 Right Median Anti Sensory (  2nd Digit)  34C Wrist    3.9 <3.4 17.6 >20 Left Ulnar Anti Sensory (5th Digit)  34C Wrist    2.7 <3.1 34.6 >12 Right Ulnar Anti Sensory (5th Digit)  34C Wrist    2.6 <3.1 30.8 >12 Motor Summary Table  Stim Site NR Onset (ms) Norm Onset (ms) O-P Amp (mV) Norm O-P Amp Site1 Site2 Delta-0 (ms) Dist (cm) Vel (m/s) Norm Vel (m/s) Left Median Motor (Abd Poll Brev)  34C Wrist    3.3 <3.9 14.7 >6 Elbow Wrist 4.6 28.0 61 >50 Elbow     7.9  13.7        Right Median Motor (Abd Poll Brev)  34C Wrist    4.2 <3.9 8.9 >6 Elbow Wrist 4.8 28.0 58 >50 Elbow    9.0  8.2        Left Ulnar Motor (Abd Dig Minimi)  34C Wrist    2.3 <3.1 9.6 >7 B Elbow Wrist 3.8 23.0 61 >50 B Elbow    6.1  9.6  A Elbow B Elbow 1.5 10.0 67 >50 A Elbow    7.6  9.6        Right Ulnar Motor (Abd Dig Minimi)  34C Wrist    1.8 <3.1 9.2 >7 B Elbow Wrist 3.6 23.0 64 >50 B Elbow    5.4  9.1  A Elbow B Elbow 1.7 10.0 59 >50 A Elbow    7.1  9.1        Right Ulnar (FDI) Motor (1st DI)  34C Wrist    3.4 <4.3 11.0 >7 B Elbow Wrist 3.9 23.0 59 >50 B Elbow    7.3  10.0  A Elbow B Elbow 1.9 10.0 53 >50 A Elbow    9.2  8.8        Comparison Summary Table  Stim Site NR Peak (ms) Norm Peak (ms) P-T Amp (V) Site1 Site2 Delta-P (ms) Norm Delta (ms) Left Median/Ulnar Palm Comparison (Wrist - 8cm)  34C Median Palm    1.8 <2.2 54.5 Median Palm Ulnar Palm 0.2  Ulnar Palm    1.6 <2.2 17.5     EMG  Side Muscle Ins Act Fibs Psw Fasc Number Recrt Dur Dur. Amp Amp. Poly Poly. Comment Right 1stDorInt Nml Nml Nml Nml Nml Nml Nml Nml Nml Nml Nml Nml N/A Right Abd Poll Brev Nml Nml Nml Nml Nml Nml Nml Nml Nml Nml Nml Nml N/A Right PronatorTeres Nml Nml Nml Nml Nml Nml Nml Nml Nml Nml Nml Nml N/A Right Triceps Nml Nml Nml Nml Nml Nml Nml Nml Nml Nml Nml Nml N/A Right Biceps Nml Nml Nml Nml Nml Nml Nml Nml Nml Nml Nml Nml N/A Right Deltoid Nml Nml Nml Nml Nml Nml Nml Nml Nml Nml Nml Nml N/A Left 1stDorInt Nml Nml Nml Nml Nml Nml Nml Nml Nml Nml Nml Nml N/A Left PronatorTeres Nml Nml Nml Nml Nml Nml Nml Nml Nml Nml Nml Nml N/A Left Biceps Nml Nml Nml Nml Nml Nml Nml Nml Nml Nml Nml Nml N/A Left Triceps Nml Nml Nml Nml Nml Nml Nml Nml Nml Nml Nml Nml N/A Left Deltoid Nml Nml Nml Nml Nml Nml Nml Nml Nml Nml Nml Nml N/A Waveforms:                Procedure: Real-time Ultrasound Guided Injection of right posterior interosseous nerve at an arcade dr frosche (radial tunnel injection) Device: Philips Affiniti  50G Images permanently stored and available for review in  PACS Verbal informed consent obtained.  Discussed risks and benefits of procedure. Warned about infection bleeding damage to structures skin hypopigmentation and fat atrophy and temporary and weakness among others. Patient expresses understanding and agreement Time-out conducted.   Noted no overlying erythema, induration, or other signs of local infection.   Skin prepped in a sterile fashion.   Local anesthesia: Topical Ethyl chloride.   With sterile technique and under real time ultrasound guidance: 40 mg of Kenalog and 2 mL of lidocaine injected into space surrounding posterior knee osseous nerve right lateral forearm area of tenderness at or near arcade dr frosche. Fluid seen entering the space around the posterior interosseous nerve. Completed without difficulty   Pain in the lateral forearm area immediately resolved suggesting accurate placement of the medication.   Patient did experience weakness to finger extension following this injection also which indicates accurate placement of medication especially lidocaine. Advised to call if fevers/chills, erythema, induration, drainage, or persistent bleeding.   Images permanently stored and available for review in the ultrasound unit.  Impression: Technically successful ultrasound guided injection.       Assessment and Plan: 45 y.o. male with multifactorial right arm pain.  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  Left arm pain due to medial epicondylitis.  Discussed treatment options today.  Plan for diagnostic and therapeutic (hopefully) radial tunnel injection today.  Check back in a month or sooner if needed.  Additionally will start nitroglycerin patch protocol and home exercise program for lateral epicondylitis right and medial epicondylitis left.  The weakness patient is  experiencing today following the injection will likely resolve after few hours once the lidocaine wears off.   PDMP not reviewed this encounter. Orders Placed This Encounter  Procedures   [place] LIMITED JOINT SPACE STRUCTURES UP RIGHT(NO LINKED CHARGES)    Order Specific Question:   Reason for Exam (SYMPTOM  OR DIAGNOSIS REQUIRED)    Answer:   R hand pain    Order Specific Question:   Preferred imaging location?    Answer:   Adult nurse [facility] ordered this encounter  Medications   nitroGLYCERIN (NITRODUR - DOSED IN MG/24 HR) 0.2 mg/hr patch    Sig: Apply 1/4 patch daily to tendon for tendonitis.    Dispense:  30 patch    Refill:  1     Discussed warning signs or symptoms. Please see discharge instructions. Patient expresses understanding.   The above documentation has been reviewed and is accurate and complete Clementeen Graham, M.D.

## 2021-06-19 NOTE — Patient Instructions (Addendum)
Good to see you today.  You had a R radial  tunnel injection.  Call or go to the ER if you develop a large red swollen joint with extreme pain or oozing puss.   Nitroglycerin Protocol  Apply 1/4 nitroglycerin patch to affected area daily. Change position of patch within the affected area every 24 hours. You may experience a headache during the first 1-2 weeks of using the patch, these should subside. If you experience headaches after beginning nitroglycerin patch treatment, you may take your preferred over the counter pain reliever. Another side effect of the nitroglycerin patch is skin irritation or rash related to patch adhesive. Please notify our office if you develop more severe headaches or rash, and stop the patch. Tendon healing with nitroglycerin patch may require 12 to 24 weeks depending on the extent of injury. Men should not use if taking Viagra, Cialis, or Levitra.  Do not use if you have migraines or rosacea.    Therband flexbar  Recheck in 1 month or sooner. I can inject things sooner if needed.

## 2021-06-20 ENCOUNTER — Encounter: Payer: Self-pay | Admitting: Family Medicine

## 2021-07-18 NOTE — Progress Notes (Deleted)
   I, Christoper Fabian, LAT, ATC, am serving as scribe for Dr. Clementeen Graham.  John Valenzuela is a 45 y.o. male who presents to [facility] at [facility] today for f/u of B hand and elbow pain and B hand paresthesias (fingers 3-5), R>L.  He was last seen by Dr. Denyse Amass on 06/19/21 after completing 3-4 PT sessions w/ little to no change in his symptoms.  He had a R radial tunnel steroid injection at his last visit and was prescribed nitroglycerin patches.  Today, pt reports   Diagnostic testing: UE NCV/EMG- 06/17/21; R and L hand XR- 05/14/21  Pertinent review of systems: ***  Relevant historical information: ***   Exam:  There were no vitals taken for this visit. General: Well Developed, well nourished, and in no acute distress.   MSK: ***    Lab and Radiology Results No results found for this or any previous visit (from the past 72 hour(s)). No results found.     Assessment and Plan: 45 y.o. male with ***   PDMP not reviewed this encounter. No orders of the defined types were placed in this encounter.  No orders of the defined types were placed in this encounter.    Discussed warning signs or symptoms. Please see discharge instructions. Patient expresses understanding.   ***

## 2021-07-21 ENCOUNTER — Ambulatory Visit: Payer: No Typology Code available for payment source | Admitting: Family Medicine

## 2021-07-29 ENCOUNTER — Encounter: Payer: Self-pay | Admitting: Family Medicine

## 2021-07-29 ENCOUNTER — Ambulatory Visit: Payer: Self-pay

## 2021-07-29 ENCOUNTER — Ambulatory Visit (INDEPENDENT_AMBULATORY_CARE_PROVIDER_SITE_OTHER): Payer: No Typology Code available for payment source | Admitting: Family Medicine

## 2021-07-29 ENCOUNTER — Other Ambulatory Visit: Payer: Self-pay

## 2021-07-29 VITALS — BP 138/78 | HR 66 | Ht 71.0 in | Wt 258.2 lb

## 2021-07-29 DIAGNOSIS — M7711 Lateral epicondylitis, right elbow: Secondary | ICD-10-CM | POA: Diagnosis not present

## 2021-07-29 DIAGNOSIS — M25521 Pain in right elbow: Secondary | ICD-10-CM | POA: Diagnosis not present

## 2021-07-29 NOTE — Patient Instructions (Addendum)
Good to see you today.  You had a R lateral epicondyle injection.  Call or go to the ER if you develop a large red swollen joint with extreme pain or oozing puss.   Con't the home exercises.  Follow-up: as needed

## 2021-07-29 NOTE — Progress Notes (Signed)
I, Philbert Riser, LAT, ATC acting as a scribe for Clementeen Graham, MD.  John Valenzuela is a 45 y.o. male who presents to [facility] at [facility] today for f/u of B hand and elbow pain and hand paresthesias due to R carpal tunnel syndrome, R radial tunnel syndrome, R lateral epicondylitis, and L medial epicondylitis. 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 Pt was last seen by Dr. Denyse Amass on 06/19/21 and was given a R radial tunnel steroid injection. Pt was also advised to start the nitroglycerin patch protocol and home exercise program for lateral epicondylitis R and medial epicondylitis L. Today, pt reports that the injection at his last visit helped relieve his R wrist pain and R hand paresthesias in his fingers 3-5.  Today, pt reports the majority of his issues are located at his R distal biceps tendon and R lateral epicondyle.  Aggravating factors include excessive R forearm pronation but does not notice as much issue w/ resisted elbow flexion.  Dx testing: 06/17/21 Bilat UE NCV/EMG  05/14/21 Bilat hand XR  Pertinent review of systems: No fevers or chills  Relevant historical information: Works as a Teacher, adult education   Exam:  BP 138/78 (BP Location: Right Arm, Patient Position: Sitting, Cuff Size: Large)   Pulse 66   Ht 5\' 11"  (1.803 m)   Wt 258 lb 3.2 oz (117.1 kg)   SpO2 96%   BMI 36.01 kg/m  General: Well Developed, well nourished, and in no acute distress.   MSK: Right elbow normal-appearing Tender palpation at lateral epicondyle and along the lateral forearm musculature.  Nontender at distal biceps tendon. Pain reproduced with resisted elbow flexion and resisted forearm supination. No pain with elbow flexion.    Lab and Radiology Results  Procedure: Real-time Ultrasound Guided Injection of right lateral epicondyle Device: Philips Affiniti 50G Images permanently stored and available for  review in PACS Ultrasound evaluation prior to injection reveals an avulsion fleck at lateral epicondyle.  No visible tear at common extensor tendon origin. Consistent with lateral epicondylitis Verbal informed consent obtained.  Discussed risks and benefits of procedure. Warned about infection bleeding damage to structures skin hypopigmentation and fat atrophy among others. Patient expresses understanding and agreement Time-out conducted.   Noted no overlying erythema, induration, or other signs of local infection.   Skin prepped in a sterile fashion.   Local anesthesia: Topical Ethyl chloride.   With sterile technique and under real time ultrasound guidance: 40 mg of Kenalog and 2 mL of lidocaine injected into common extensor tendon and insertion site onto lateral epicondyle. Fluid seen entering the lateral epicondyle.   Completed without difficulty   Pain immediately resolved suggesting accurate placement of the medication.   Advised to call if fevers/chills, erythema, induration, drainage, or persistent bleeding.   Images permanently stored and available for review in the ultrasound unit.  Impression: Technically successful ultrasound guided injection.     Assessment and Plan: 45 y.o. male with right forearm pain.  Multifactorial.  About 6 weeks ago patient was seen for lateral forearm pain and some distal paresthesias ultimately thought to be due to radial tunnel syndrome.  He had a trial of diagnostic and therapeutic injection and feels much better than the more distal forearm pain and somewhat unusually the distal paresthesias.  However he still has some pain in the lateral epicondyle and lateral forearm muscle region that is thought to be  due to lateral epicondylitis.  After evaluation and discussion patient would like to proceed with injection. Injection performed.  Continue home exercise program and recheck as needed.   PDMP not reviewed this encounter. Orders Placed This  Encounter  Procedures   [place] LIMITED JOINT SPACE STRUCTURES UP RIGHT(NO LINKED CHARGES)    Order Specific Question:   Reason for Exam (SYMPTOM  OR DIAGNOSIS REQUIRED)    Answer:   R elbow pain    Order Specific Question:   Preferred imaging location?    Answer:   [facility]   No orders of the defined types were placed in this encounter.    Discussed warning signs or symptoms. Please see discharge instructions. Patient expresses understanding.   The above documentation has been reviewed and is accurate and complete Clementeen Graham, M.D.

## 2021-09-01 ENCOUNTER — Ambulatory Visit: Payer: No Typology Code available for payment source | Admitting: Family Medicine

## 2021-09-17 ENCOUNTER — Encounter: Payer: Self-pay | Admitting: Family Medicine

## 2021-09-18 MED ORDER — DICLOFENAC SODIUM 75 MG PO TBEC: 75.0000 mg | DELAYED_RELEASE_TABLET | Freq: Two times a day (BID) | ORAL | 0 refills | Status: DC | PRN

## 2021-10-03 ENCOUNTER — Other Ambulatory Visit: Payer: Self-pay | Admitting: Family Medicine

## 2022-02-06 ENCOUNTER — Ambulatory Visit (INDEPENDENT_AMBULATORY_CARE_PROVIDER_SITE_OTHER): Payer: No Typology Code available for payment source

## 2022-02-06 ENCOUNTER — Ambulatory Visit: Payer: No Typology Code available for payment source | Admitting: Family Medicine

## 2022-02-06 VITALS — BP 142/82 | HR 84 | Ht 71.0 in | Wt 259.0 lb

## 2022-02-06 DIAGNOSIS — M5441 Lumbago with sciatica, right side: Secondary | ICD-10-CM | POA: Diagnosis not present

## 2022-02-06 DIAGNOSIS — M25551 Pain in right hip: Secondary | ICD-10-CM

## 2022-02-06 DIAGNOSIS — M25552 Pain in left hip: Secondary | ICD-10-CM

## 2022-02-06 DIAGNOSIS — M5442 Lumbago with sciatica, left side: Secondary | ICD-10-CM

## 2022-02-06 MED ORDER — TIZANIDINE HCL 4 MG PO TABS: 4.0000 mg | ORAL_TABLET | Freq: Four times a day (QID) | ORAL | 1 refills | Status: DC | PRN

## 2022-02-06 MED ORDER — PREDNISONE 50 MG PO TABS: 50.0000 mg | ORAL_TABLET | Freq: Every day | ORAL | 0 refills | Status: DC

## 2022-02-06 NOTE — Patient Instructions (Signed)
Thank you for coming in today.  ? ?I've referred you to Physical Therapy.  Let us know if you don't hear from them in one week.  ? ?Please get an Xray today before you leave  ? ?Try the medicine.  ?Let me know.  ? ? ?

## 2022-02-06 NOTE — Progress Notes (Signed)
? ?I, Philbert Riser, LAT, ATC acting as a scribe for Clementeen Graham, MD. ? ?John Valenzuela is a 46 y.o. male who presents to [facility] at [facility] today for L-sided low back pain. Pt was previously seen by Dr. Denyse Amass on 07/29/21 for R lateral epicondylitis. Today, pt report LBP ongoing since the 1st week in April w/ no known MOI. Pt locates pain to the L side on the low back, pain originated on the R. Pt works as a Manufacturing systems engineer. ? ?Patient does have some pain radiating to his anterior thighs and hips bilaterally. ? ?Radiating pain: yes- L posterior thigh to knee ?LE numbness/tingling: yes- sometimes L 3-5 toes ?LE weakness: no ?Aggravates: trunk rotation and forward flexion ?Treatments tried: IBU 800mg  bid, stretches ? ?Dx imaging: 03/29/11 L-spine XR ? ?Pertinent review of systems: No fevers or chills ? ?Relevant historical information: History of rotator cuff tendinitis and radial tunnel syndrome. ? ? ?Exam:  ?BP (!) 142/82   Pulse 84   Ht 5\' 11"  (1.803 m)   Wt 259 lb (117.5 kg)   SpO2 98%   BMI 36.12 kg/m?  ?General: Well Developed, well nourished, and in no acute distress.  ? ?MSK: Lumbar spine: Normal appearing ?Nontender midline.  Mildly tender palpation bilateral lumbar paraspinal musculature. ?Decreased lumbar motion pain with flexion. ?Lower extremity strength is intact. ?Reflexes are intact bilaterally. ? ? ?Lab and Radiology Results ? ?X-ray images lumbar spine and AP pelvis obtained today personally and independently interpreted ? 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  No acute fractures are present. ? 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 ? ?Await formal radiology review ? ? ? ? ?Assessment and Plan: ?46 y.o. male with acute low back pain.  Pain has been ongoing for about 5 weeks and failing to improve with typical conservative management including home exercise program a little bit of chiropractor care and some  strengthening and stretching and self massage. ? ?He may have a radicular component to L2 bilaterally or his pain may be due to some hip DJD.  Plan for trial of prednisone and gabapentin.  Additionally we will use tizanidine especially at bedtime if needed.  Refer to physical therapy and recheck in about 6 weeks especially if not improved.  Certainly could proceed to MRI lumbar spine if not improving. ? ? ?PDMP not reviewed this encounter. ?Orders Placed This Encounter  ?Procedures  ? DG Lumbar Spine 2-3 Views  ?  Standing Status:   Future  ?  Number of Occurrences:   1  ?  Standing Expiration Date:   02/07/2023  ?  Order Specific Question:   Reason for Exam (SYMPTOM  OR DIAGNOSIS REQUIRED)  ?  Answer:   eval low back pain  ?  Order Specific Question:   Preferred imaging location?  ?  Answer:   59  ? DG Pelvis 1-2 Views  ?  Standing Status:   Future  ?  Number of Occurrences:   1  ?  Standing Expiration Date:   02/07/2023  ?  Order Specific Question:   Reason for Exam (SYMPTOM  OR DIAGNOSIS REQUIRED)  ?  Answer:   eval ant hip pain bil  ?  Order Specific Question:   Preferred imaging location?  ?  Answer:   [facility]  ? Ambulatory referral to Physical Therapy  ?  Referral Priority:   Routine  ?  Referral Type:  Physical Medicine  ?  Referral Reason:   Specialty Services Required  ?  Requested Specialty:   Physical Therapy  ?  Number of Visits Requested:   1  ? ?Meds ordered this encounter  ?Medications  ? predniSONE (DELTASONE) 50 MG tablet  ?  Sig: Take 1 tablet (50 mg total) by mouth daily.  ?  Dispense:  5 tablet  ?  Refill:  0  ? tiZANidine (ZANAFLEX) 4 MG tablet  ?  Sig: Take 1 tablet (4 mg total) by mouth every 6 (six) hours as needed for muscle spasms.  ?  Dispense:  30 tablet  ?  Refill:  1  ? ? ? ?Discussed warning signs or symptoms. Please see discharge instructions. Patient expresses understanding. ? ? ?The above documentation has been reviewed and is accurate and complete Clementeen Graham, M.D. ? ? ?

## 2022-02-10 NOTE — Progress Notes (Signed)
Pelvis x-ray looks normal to radiology.  They did not comment on hip arthritis

## 2022-02-10 NOTE — Progress Notes (Signed)
Lumbar spine x-ray shows arthritis changes at L5-S1.

## 2022-02-25 ENCOUNTER — Ambulatory Visit: Payer: No Typology Code available for payment source | Admitting: Physical Therapy

## 2022-02-25 ENCOUNTER — Encounter: Payer: Self-pay | Admitting: Physical Therapy

## 2022-02-25 DIAGNOSIS — M62838 Other muscle spasm: Secondary | ICD-10-CM | POA: Diagnosis not present

## 2022-02-25 DIAGNOSIS — M5459 Other low back pain: Secondary | ICD-10-CM

## 2022-02-25 NOTE — Therapy (Signed)
OUTPATIENT PHYSICAL THERAPY  EVALUATION   Patient Name: John Valenzuela MRN: [id number] DOB:01-14-1976, 46 y.o., male Today's Date: 02/25/22   PT End of Session - 03/01/22 2106     Visit Number 1    Number of Visits 16    Date for PT Re-Evaluation 04/22/22    Authorization Type Aetna    PT Start Time [phone]    PT Stop Time 0900    PT Time Calculation (min) 54 min    Activity Tolerance Patient tolerated treatment well    Behavior During Therapy [facility] for tasks assessed/performed             History reviewed. No pertinent past medical history. History reviewed. No pertinent surgical history. Patient Active Problem List   Diagnosis Date Noted   Radial tunnel syndrome, right 06/19/2021   ROTATOR CUFF SYNDROME, RIGHT 10/20/2010   TRANSIENT ISCHEMIC ATTACKS, HX OF 10/20/2010    PCP: No PCP  REFERRING PROVIDER: Clementeen Graham  REFERRING DIAG: Low back pain  Rationale for Evaluation and Treatment Rehabilitation  THERAPY DIAG:  Other low back pain  Other muscle spasm  ONSET DATE: Early April 2023  SUBJECTIVE:                                                                                                                                                                                           SUBJECTIVE STATEMENT: Pt states onset of back pain in April. No incident to report. But he does lift a few days/wk, works as Teacher, adult education, and also coaches baseball. Notes increased pain with bending, Hip Rotation, and getting in/out of car. Pain has improved since April, but is taking longer to go away, and has not fully resolved. Pt has been working on self muscle tension relief.   PERTINENT HISTORY:    PAIN:  Are you having pain? Yes: NPRS scale: 6/10 Pain location: bil low back, into L LE  Pain description: Sore, variable, radiating  Aggravating factors: bending, flexion, hip ER Relieving factors: none stated    PRECAUTIONS: None  WEIGHT BEARING RESTRICTIONS No  FALLS:   Has patient fallen in last 6 months? No   PLOF: Independent  PATIENT GOALS   Decreased pain in back , return to work and exercise without pain    OBJECTIVE:   DIAGNOSTIC FINDINGS:    COGNITION:  Overall cognitive status: Within functional limits for tasks assessed      POSTURE: anterior pelvic tilt  PALPATION: Tenderness and tightness in L and R lumbar parapinals, L more sore, and R more tight. Tightness soreness into L glute med, min, piriformis, deep in L QL. Some in  L SI  LUMBAR ROM:   Hip ROM: WFL ,  Active  AROM  eval  Flexion Mild limitation   Extension Mod limitation  Right lateral flexion Mild limitation  Left lateral flexion Mild limitation  Right rotation   Left rotation    (Blank rows = not tested)   LOWER EXTREMITY MMT:    Hip flex: 4/5,  Hip abd: 4/5,  Ext: 4-/5 (pain on L)    LUMBAR SPECIAL TESTS:  Neg SLR but some contralateral pain  GAIT: unremarkable   TODAY'S TREATMENT  Ther ex: see below for HEP   PATIENT EDUCATION:  Education details: PT POC, Exam findings, HEP Person educated: Patient Education method: Explanation, Demonstration, Tactile cues, Verbal cues, and Handouts Education comprehension: verbalized understanding, returned demonstration, verbal cues required, tactile cues required, and needs further education   HOME EXERCISE PROGRAM: Access Code: [email or web address] URL: [email or web address] Date: 02/25/2022 Prepared by: Sedalia Muta  Exercises - Prone Press Up On Elbows  - 4 x daily - 1-2 sets - 10 reps - Supine Figure 4 Piriformis Stretch  - 2 x daily - 3 reps - 30 hold - Supine Posterior Pelvic Tilt  - 2 x daily - 2 sets - 10 reps - Supine Transversus Abdominis Bracing - Hands on Ground  - 1 x daily - 2 sets - 10 reps - 5 hold   ASSESSMENT:  CLINICAL IMPRESSION: Patient presents with primary complaint of increased pain in L side of back. He has increased muscle tension in bil lumbar region and QL. He has  decreased ability for full functional activities due to pain. Pt to benefit from skilled PT to improve deficits and pain.    OBJECTIVE IMPAIRMENTS decreased activity tolerance, decreased mobility, increased fascial restrictions, increased muscle spasms, impaired flexibility, and pain.   ACTIVITY LIMITATIONS lifting, bending, standing, and squatting  PARTICIPATION LIMITATIONS: cleaning, laundry, community activity, occupation, and yard work  PERSONAL FACTORS  none  are also affecting patient's functional outcome.   REHAB POTENTIAL: Good  CLINICAL DECISION MAKING: Stable/uncomplicated  EVALUATION COMPLEXITY: Low   GOALS: Goals reviewed with patient? Yes  SHORT TERM GOALS: Target date: 03/15/2022  Pt to be independent with initial HEP  Goal status: INITIAL  2.  Pt to report decreased pain in back to 0-3/10  Goal status: INITIAL     LONG TERM GOALS: Target date: 04/26/2022  Pt to be independent with final HEP  Goal status: INITIAL  2.  Pt to report decreased pain in back to 0-2/10 to improve ability for ADLS. And IADLs.   Goal status: INITIAL  3.  Pt to demo soft tissue limitations in lumbar region to be WNL to decrease muscle tension and pain.   Goal status: INITIAL  4.  Pt to report ability for work duties for at least 1 hr without pain.   Goal status: INITIAL  5.  Pt to demo ability for bend, lift, squat with optimal mechanics and no pain, to improve ability for IADLs and community activities.  :  Goal status: INITIAL     PLAN: PT FREQUENCY: 1-2x/week  PT DURATION: 8 weeks  PLANNED INTERVENTIONS: Therapeutic exercises, Therapeutic activity, Neuromuscular re-education, Balance training, Gait training, Patient/Family education, Joint manipulation, Joint mobilization, Stair training, Orthotic/Fit training, DME instructions, Dry Needling, Electrical stimulation, Spinal manipulation, Spinal mobilization, Cryotherapy, Moist heat, Taping, Traction, Ultrasound,  Ionotophoresis 4mg /ml Dexamethasone, and Manual therapy.  PLAN FOR NEXT SESSION:    , PT, DPT 9:08 PM  03/01/22

## 2022-03-01 ENCOUNTER — Encounter: Payer: Self-pay | Admitting: Physical Therapy

## 2022-03-03 ENCOUNTER — Encounter: Payer: Self-pay | Admitting: Physical Therapy

## 2022-03-03 ENCOUNTER — Ambulatory Visit: Payer: No Typology Code available for payment source | Admitting: Physical Therapy

## 2022-03-03 DIAGNOSIS — M5459 Other low back pain: Secondary | ICD-10-CM

## 2022-03-03 DIAGNOSIS — M62838 Other muscle spasm: Secondary | ICD-10-CM | POA: Diagnosis not present

## 2022-03-03 NOTE — Therapy (Signed)
OUTPATIENT PHYSICAL THERAPY TREATMENT   Patient Name: John Valenzuela MRN: [id number] DOB:[birth year], 46 y.o., male Today's Date: 03/03/22   PT End of Session - 03/03/22 0914     Visit Number 2    Number of Visits 16    Date for PT Re-Evaluation 04/22/22    Authorization Type Aetna    PT Start Time 0802    PT Stop Time 0852    PT Time Calculation (min) 50 min    Activity Tolerance Patient tolerated treatment well    Behavior During Therapy [facility] for tasks assessed/performed              History reviewed. No pertinent past medical history. History reviewed. No pertinent surgical history. Patient Active Problem List   Diagnosis Date Noted   Radial tunnel syndrome, right 06/19/2021   ROTATOR CUFF SYNDROME, RIGHT 10/20/2010   TRANSIENT ISCHEMIC ATTACKS, HX OF 10/20/2010    PCP: No PCP  REFERRING PROVIDER: Clementeen Graham  REFERRING DIAG: Low back pain  Rationale for Evaluation and Treatment Rehabilitation  THERAPY DIAG:  Other low back pain  Other muscle spasm  ONSET DATE: Early April 2023  SUBJECTIVE:                                                                                                                                                                                           SUBJECTIVE STATEMENT: Pt states ongoing pain/tightness in Bil low back/QL region. Did see chiro last week, was adjusted, and did traction table. Felt good after this, and after last PT session, but still feeling same pain.   PERTINENT HISTORY:    PAIN:  Are you having pain? Yes: NPRS scale:  6/10 Pain location: bil low back, into L LE  Pain description: Sore, variable, radiating  Aggravating factors: bending, flexion, hip ER Relieving factors: none stated    PRECAUTIONS: None  WEIGHT BEARING RESTRICTIONS No  FALLS:  Has patient fallen in last 6 months? No   PLOF: Independent  PATIENT GOALS   Decreased pain in back , return to work and exercise without pain    OBJECTIVE:    DIAGNOSTIC FINDINGS:    COGNITION:  Overall cognitive status: Within functional limits for tasks assessed      POSTURE: anterior pelvic tilt  PALPATION: Tenderness and tightness in L and R lumbar parapinals, L more sore, and R more tight. Tightness soreness into L glute med, min, piriformis, deep in L QL. Some in L SI  LUMBAR ROM:   Hip ROM: WFL ,  Active  AROM  eval  Flexion Mild limitation   Extension Mod limitation  Right lateral flexion  Mild limitation  Left lateral flexion Mild limitation  Right rotation   Left rotation    (Blank rows = not tested)   LOWER EXTREMITY MMT:    Hip flex: 4/5,  Hip abd: 4/5,  Ext: 4-/5 (pain on L)    LUMBAR SPECIAL TESTS:  Neg SLR but some contralateral pain  GAIT: unremarkable   TODAY'S TREATMENT   Therapeutic Exercise: Supine:  TA x 10;  Supine march x 15;  bridging x 20; SL bridge x 5 bil;  Seated: Standing: Stretches: prone press ups to hands x 20;  Childs pose 30 sec x 3 each L, R, center;  LTR x 10;   Neuromuscular Re-education: Manual Therapy: DTM/TPR to bil QL and lumbar paraspinals, Long leg distraction bil for lumbar decompression Trigger Point Dry-Needling  Treatment instructions: Expect mild to moderate muscle soreness. S/S of pneumothorax if dry needled over a lung field, and to seek immediate medical attention should they occur. Patient verbalized understanding of these instructions and education.  Patient Consent Given: Yes Education handout provided: Yes Muscles treated: bil QL Electrical stimulation performed: No Parameters: N/A Treatment response/outcome: Palpable increase in muscle length.     PATIENT EDUCATION:  Education details: reviewed HEP Person educated: Patient Education method: Explanation, Demonstration, Tactile cues, Verbal cues, and Handouts Education comprehension: verbalized understanding, returned demonstration, verbal cues required, tactile cues required, and needs further  education   HOME EXERCISE PROGRAM: Access Code: [email or web address]   ASSESSMENT:  CLINICAL IMPRESSION: Patient with most pain in bil QL region. Focus on muscle tension release for this region today, with ther ex and manual therapy. Discussed continued decompression/traction as able, and stretching for QL and lats.     OBJECTIVE IMPAIRMENTS decreased activity tolerance, decreased mobility, increased fascial restrictions, increased muscle spasms, impaired flexibility, and pain.   ACTIVITY LIMITATIONS lifting, bending, standing, and squatting  PARTICIPATION LIMITATIONS: cleaning, laundry, community activity, occupation, and yard work  PERSONAL FACTORS  none  are also affecting patient's functional outcome.   REHAB POTENTIAL: Good  CLINICAL DECISION MAKING: Stable/uncomplicated  EVALUATION COMPLEXITY: Low   GOALS: Goals reviewed with patient? Yes  SHORT TERM GOALS: Target date: 03/17/2022  Pt to be independent with initial HEP  Goal status: INITIAL  2.  Pt to report decreased pain in back to 0-3/10  Goal status: INITIAL     LONG TERM GOALS: Target date: 04/28/2022  Pt to be independent with final HEP  Goal status: INITIAL  2.  Pt to report decreased pain in back to 0-2/10 to improve ability for ADLS. And IADLs.   Goal status: INITIAL  3.  Pt to demo soft tissue limitations in lumbar region to be WNL to decrease muscle tension and pain.   Goal status: INITIAL  4.  Pt to report ability for work duties for at least 1 hr without pain.   Goal status: INITIAL  5.  Pt to demo ability for bend, lift, squat with optimal mechanics and no pain, to improve ability for IADLs and community activities.  :  Goal status: INITIAL     PLAN: PT FREQUENCY: 1-2x/week  PT DURATION: 8 weeks  PLANNED INTERVENTIONS: Therapeutic exercises, Therapeutic activity, Neuromuscular re-education, Balance training, Gait training, Patient/Family education, Joint manipulation, Joint mobilization,  Stair training, Orthotic/Fit training, DME instructions, Dry Needling, Electrical stimulation, Spinal manipulation, Spinal mobilization, Cryotherapy, Moist heat, Taping, Traction, Ultrasound, Ionotophoresis 4mg /ml Dexamethasone, and Manual therapy.  PLAN FOR NEXT SESSION:    , PT, DPT 9:14 AM  03/03/22

## 2022-03-10 ENCOUNTER — Ambulatory Visit (INDEPENDENT_AMBULATORY_CARE_PROVIDER_SITE_OTHER): Payer: No Typology Code available for payment source | Admitting: Physical Therapy

## 2022-03-10 ENCOUNTER — Encounter: Payer: Self-pay | Admitting: Physical Therapy

## 2022-03-10 DIAGNOSIS — M62838 Other muscle spasm: Secondary | ICD-10-CM

## 2022-03-10 DIAGNOSIS — M5459 Other low back pain: Secondary | ICD-10-CM

## 2022-03-10 NOTE — Therapy (Addendum)
OUTPATIENT PHYSICAL THERAPY TREATMENT   Patient Name: John Valenzuela MRN: [id number] DOB:07/07/1976, 46 y.o., male Today's Date: 03/10/22   PT End of Session - 03/10/22 0924     Visit Number 3    Number of Visits 16    Date for PT Re-Evaluation 04/22/22    Authorization Type [employer]    PT Start Time [phone]    PT Stop Time 0830    PT Time Calculation (min) 41 min    Activity Tolerance Patient tolerated treatment well    Behavior During Therapy [facility] for tasks assessed/performed              History reviewed. No pertinent past medical history. History reviewed. No pertinent surgical history. Patient Active Problem List   Diagnosis Date Noted   Radial tunnel syndrome, right 06/19/2021   ROTATOR CUFF SYNDROME, RIGHT 10/20/2010   TRANSIENT ISCHEMIC ATTACKS, HX OF 10/20/2010    PCP: No PCP  REFERRING PROVIDER: Clementeen Graham  REFERRING DIAG: Low back pain  Rationale for Evaluation and Treatment Rehabilitation  THERAPY DIAG:  Other low back pain  Other muscle spasm  ONSET DATE: Early April 2023  SUBJECTIVE:                                                                                                                                                                                           SUBJECTIVE STATEMENT: Pt states continued pain, mostly in L QL region. Has been doing HEP. Did get traction 1 more time, felt good after that.    PERTINENT HISTORY:    PAIN:  Are you having pain? Yes: NPRS scale: 4/10 Pain location: bil low back, into L LE  Pain description: Sore, variable, radiating  Aggravating factors: bending, flexion, hip ER Relieving factors: none stated    PRECAUTIONS: None  WEIGHT BEARING RESTRICTIONS No  FALLS:  Has patient fallen in last 6 months? No   PLOF: Independent  PATIENT GOALS   Decreased pain in back , return to work and exercise without pain    OBJECTIVE:   DIAGNOSTIC FINDINGS:    COGNITION:  Overall cognitive status: Within  functional limits for tasks assessed      POSTURE: anterior pelvic tilt  PALPATION: Tenderness and tightness in L and R lumbar parapinals, L more sore, and R more tight. Tightness soreness into L glute med, min, piriformis, deep in L QL. Some in L SI  LUMBAR ROM:   Hip ROM: WFL ,  Active  AROM  eval  Flexion Mild limitation   Extension Mod limitation  Right lateral flexion Mild limitation  Left lateral flexion Mild limitation  Right rotation  Left rotation    (Blank rows = not tested)   LOWER EXTREMITY MMT:    Hip flex: 4/5,  Hip abd: 4/5,  Ext: 4-/5 (pain on L)    LUMBAR SPECIAL TESTS:  Neg SLR but some contralateral pain  GAIT: unremarkable   TODAY'S TREATMENT  03/10/22 Therapeutic Exercise: Supine:   Seated: Standing: Stretches:  Childs pose 30 sec x 3 each L, R, center;  LTR x 10;  Hip flexor stretch(thomas test position), with manual psoas release; Kneeling hip flexor stretch x 2 bil;  Neuromuscular Re-education: Manual Therapy: DTM/TPR/IASTM  to L QL and lumbar paraspinals    PATIENT EDUCATION:  Education details: reviewed HEP Person educated: Patient Education method: Explanation, Demonstration, Tactile cues, Verbal cues, and Handouts Education comprehension: verbalized understanding, returned demonstration, verbal cues required, tactile cues required, and needs further education   HOME EXERCISE PROGRAM: Access Code: [email or web address]   ASSESSMENT:  CLINICAL IMPRESSION: Patient with most pain in L QL and paraspinals today, focus on this region with manual tissue release. Pt with improved pain after session today, also with improved flexion and extension ROM. Will benefit from continued care.    OBJECTIVE IMPAIRMENTS decreased activity tolerance, decreased mobility, increased fascial restrictions, increased muscle spasms, impaired flexibility, and pain.   ACTIVITY LIMITATIONS lifting, bending, standing, and squatting  PARTICIPATION LIMITATIONS: cleaning,  laundry, community activity, occupation, and yard work  PERSONAL FACTORS  none  are also affecting patient's functional outcome.   REHAB POTENTIAL: Good  CLINICAL DECISION MAKING: Stable/uncomplicated  EVALUATION COMPLEXITY: Low   GOALS: Goals reviewed with patient? Yes  SHORT TERM GOALS: Target date: 03/24/2022  Pt to be independent with initial HEP  Goal status: INITIAL  2.  Pt to report decreased pain in back to 0-3/10  Goal status: INITIAL     LONG TERM GOALS: Target date: 05/05/2022  Pt to be independent with final HEP  Goal status: INITIAL  2.  Pt to report decreased pain in back to 0-2/10 to improve ability for ADLS. And IADLs.   Goal status: INITIAL  3.  Pt to demo soft tissue limitations in lumbar region to be WNL to decrease muscle tension and pain.   Goal status: INITIAL  4.  Pt to report ability for work duties for at least 1 hr without pain.   Goal status: INITIAL  5.  Pt to demo ability for bend, lift, squat with optimal mechanics and no pain, to improve ability for IADLs and community activities.  :  Goal status: INITIAL     PLAN: PT FREQUENCY: 1-2x/week  PT DURATION: 8 weeks  PLANNED INTERVENTIONS: Therapeutic exercises, Therapeutic activity, Neuromuscular re-education, Balance training, Gait training, Patient/Family education, Joint manipulation, Joint mobilization, Stair training, Orthotic/Fit training, DME instructions, Dry Needling, Electrical stimulation, Spinal manipulation, Spinal mobilization, Cryotherapy, Moist heat, Taping, Traction, Ultrasound, Ionotophoresis 4mg /ml Dexamethasone, and Manual therapy.  PLAN FOR NEXT SESSION:    , PT, DPT 9:25 AM  03/10/22   PHYSICAL THERAPY DISCHARGE SUMMARY  Visits from Start of Care: 3 Plan: Patient agrees to discharge.  Patient goals were partially met. Patient is being discharged due to meeting the stated rehab goals.  Pt did not return after visit 3.    03/12/22,  PT, DPT 11:34 AM  10/06/22

## 2022-10-27 ENCOUNTER — Ambulatory Visit (INDEPENDENT_AMBULATORY_CARE_PROVIDER_SITE_OTHER): Payer: No Typology Code available for payment source

## 2022-10-27 ENCOUNTER — Ambulatory Visit: Payer: No Typology Code available for payment source | Admitting: Family Medicine

## 2022-10-27 ENCOUNTER — Ambulatory Visit: Payer: Self-pay

## 2022-10-27 VITALS — BP 158/100 | HR 87 | Ht 71.0 in | Wt 265.0 lb

## 2022-10-27 DIAGNOSIS — M7711 Lateral epicondylitis, right elbow: Secondary | ICD-10-CM | POA: Diagnosis not present

## 2022-10-27 DIAGNOSIS — G5621 Lesion of ulnar nerve, right upper limb: Secondary | ICD-10-CM

## 2022-10-27 DIAGNOSIS — M25521 Pain in right elbow: Secondary | ICD-10-CM

## 2022-10-27 NOTE — Progress Notes (Unsigned)
I, Peterson Lombard, LAT, ATC acting as a scribe for Lynne Leader, MD.  John Valenzuela is a 47 y.o. male who presents to [place] at [facility] today for cont'd R elbow pain. Pt was last for this issue by Dr. Georgina Snell on 11/1 and on 06/19/21 was given a R radial tunnel steroid injection. 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 R elbow pain has then worsened over the last couple months. Pt locates pain the lateral aspect of the R elbow.  Grip strength: decreased Paresthesia: yes- 4th-5th fingers Aggravates: worse 1st thing in the morning, elbow flexion Treatments tried: nitro patches, brace  Dx testing: 06/17/21 UE NCV study 05/14/21 R & L hand XR  Pertinent review of systems: No fevers or chills  Relevant historical information: Radial tunnel syndrome history right elbow.   Exam:  BP (!) 158/100   Pulse 87   Ht 5\' 11"  (1.803 m)   Wt 265 lb (120.2 kg)   SpO2 96%   BMI 36.96 kg/m  General: Well Developed, well nourished, and in no acute distress.   MSK: Right elbow: Normal-appearing Tender palpation at lateral elbow at radial head and a little bit at lateral epicondyle. Normal elbow motion. Slight laxity to UCL stress. Intact strength..  He does experience some pain with resisted wrist extension. Minimally positive Tinel's cubital tunnel.    Lab and Radiology Results  Procedure: Real-time Ultrasound Guided Injection of right lateral elbow interarticular  Device: Philips Affiniti 50G Images permanently stored and available for review in PACS Ultrasound evaluation prior to injection reveals chronic calcific change at lateral epicondyles common extensor tendon origin. The radial head is normal-appearing There is a small joint effusion at the lateral elbow joint. Verbal informed consent obtained.  Discussed risks and benefits of procedure. Warned about infection, bleeding, hyperglycemia damage to structures among  others. Patient expresses understanding and agreement Time-out conducted.   Noted no overlying erythema, induration, or other signs of local infection.   Skin prepped in a sterile fashion.   Local anesthesia: Topical Ethyl chloride.   With sterile technique and under real time ultrasound guidance: 40 mg of Kenalog and 1 mL of lidocaine injected into lateral elbow joint. Fluid seen entering the joint capsule.   Completed without difficulty   Pain moderately resolved suggesting accurate placement of the medication.   Advised to call if fevers/chills, erythema, induration, drainage, or persistent bleeding.   Images permanently stored and available for review in the ultrasound unit.  Impression: Technically successful ultrasound guided injection.      No results found for this or any previous visit (from the past 72 hour(s)). DG ELBOW COMPLETE RIGHT (3+VIEW)  Result Date: 10/27/2022 CLINICAL DATA:  Elbow pain for several months EXAM: RIGHT ELBOW - COMPLETE 3+ VIEW COMPARISON:  None Available. FINDINGS: There is no evidence of fracture, dislocation, or joint effusion. There is no evidence of arthropathy or other focal bone abnormality. Soft tissues are unremarkable. IMPRESSION: No acute abnormality noted. Electronically Signed   By: Inez Catalina M.D.   On: 10/27/2022 23:46   I, Lynne Leader, personally (independently) visualized and performed the interpretation of the images attached in this note.     Assessment and Plan: 47 y.o. male with multifactorial right elbow pain.  Chronic problem acute recurrence.  John Valenzuela has pain located primarily at the lateral elbow.  He points right at the radial head and not at the lateral epicondyle.  The exacerbating activities  seem to be throwing.  I am concerned he may have some UCL laxity and is having impact on the lateral side of the elbow joint.    He certainly does have exam findings and signs and symptoms consistent with lateral epicondylitis which could  be a factor as well.  Lastly he has evidence of cubital tunnel syndrome.  He is using an elbow splint at night for that.  Plan to treat with intra-articular steroid injection, home exercise program and if not better proceed to MRI arthrogram of the elbow.   PDMP not reviewed this encounter. Orders Placed This Encounter  Procedures   [place] LIMITED JOINT SPACE STRUCTURES UP RIGHT(NO LINKED CHARGES)    Order Specific Question:   Reason for Exam (SYMPTOM  OR DIAGNOSIS REQUIRED)    Answer:   right elbow pain    Order Specific Question:   Preferred imaging location?    Answer:   [facility]   DG ELBOW COMPLETE RIGHT (3+VIEW)    Standing Status:   Future    Number of Occurrences:   1    Standing Expiration Date:   10/28/2023    Order Specific Question:   Reason for Exam (SYMPTOM  OR DIAGNOSIS REQUIRED)    Answer:   eval elbow pain    Order Specific Question:   Preferred imaging location?    Answer:   [facility]   No orders of the defined types were placed in this encounter.    Discussed warning signs or symptoms. Please see discharge instructions. Patient expresses understanding.   The above documentation has been reviewed and is accurate and complete Lynne Leader, M.D.

## 2022-10-27 NOTE — Patient Instructions (Signed)
Thank you for coming in today.   Please get an Xray today before you leave   Do the tennis elbow exercises.   Continue the night splint for cubital tunnel syndrome.   Recheck if not improving.

## 2022-10-28 NOTE — Progress Notes (Signed)
Elbow x-ray looks normal to radiology

## 2022-12-29 ENCOUNTER — Encounter: Payer: Self-pay | Admitting: Family Medicine

## 2022-12-31 MED ORDER — MELOXICAM 15 MG PO TABS: 15.0000 mg | ORAL_TABLET | Freq: Every day | ORAL | 0 refills | Status: AC | PRN

## 2023-01-27 ENCOUNTER — Other Ambulatory Visit: Payer: Self-pay | Admitting: Family Medicine

## 2023-01-27 NOTE — Telephone Encounter (Signed)
Ran out of rx in less than 30 days. Refill not appropriate.

## 2023-06-24 IMAGING — DX DG HAND COMPLETE 3+V*R*
3 series · 3 of 3 positions shown · non-contrast
Comparison: None.

CLINICAL DATA: Chronic hand pain with numbness in the third through
fifth digits related to repetitive motion at work, initial encounter

EXAM:
RIGHT HAND - COMPLETE 3+ VIEW

[hand ap]
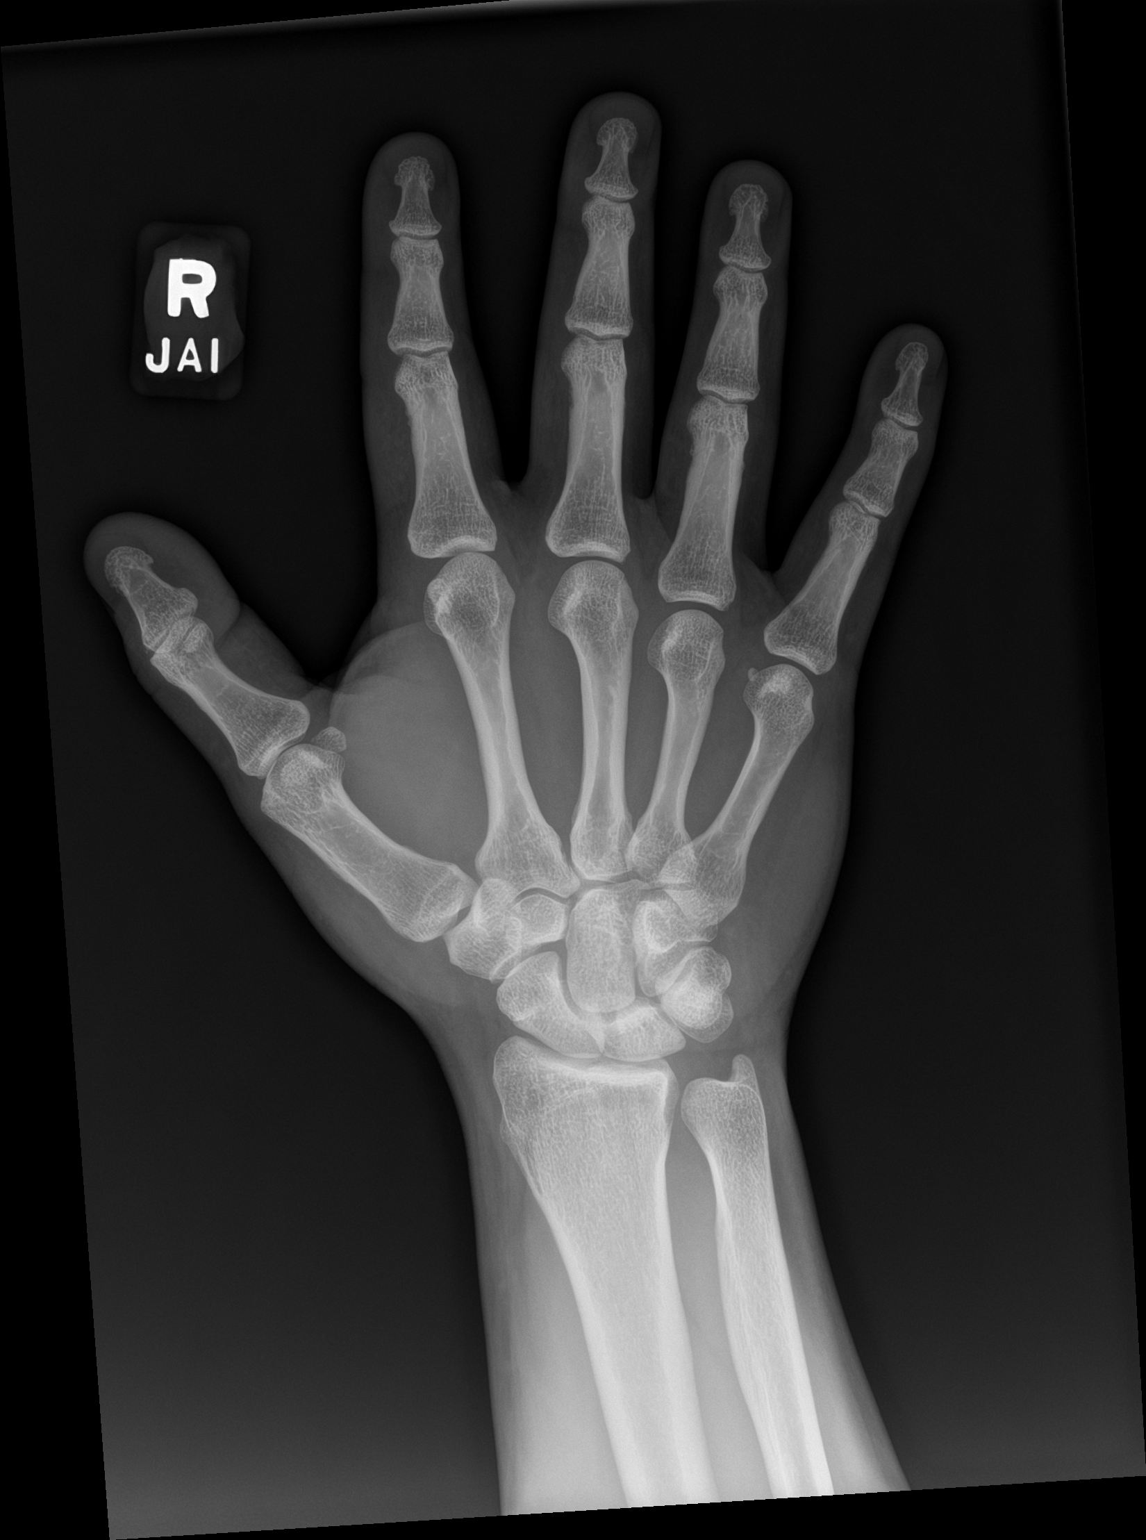

[hand obl]
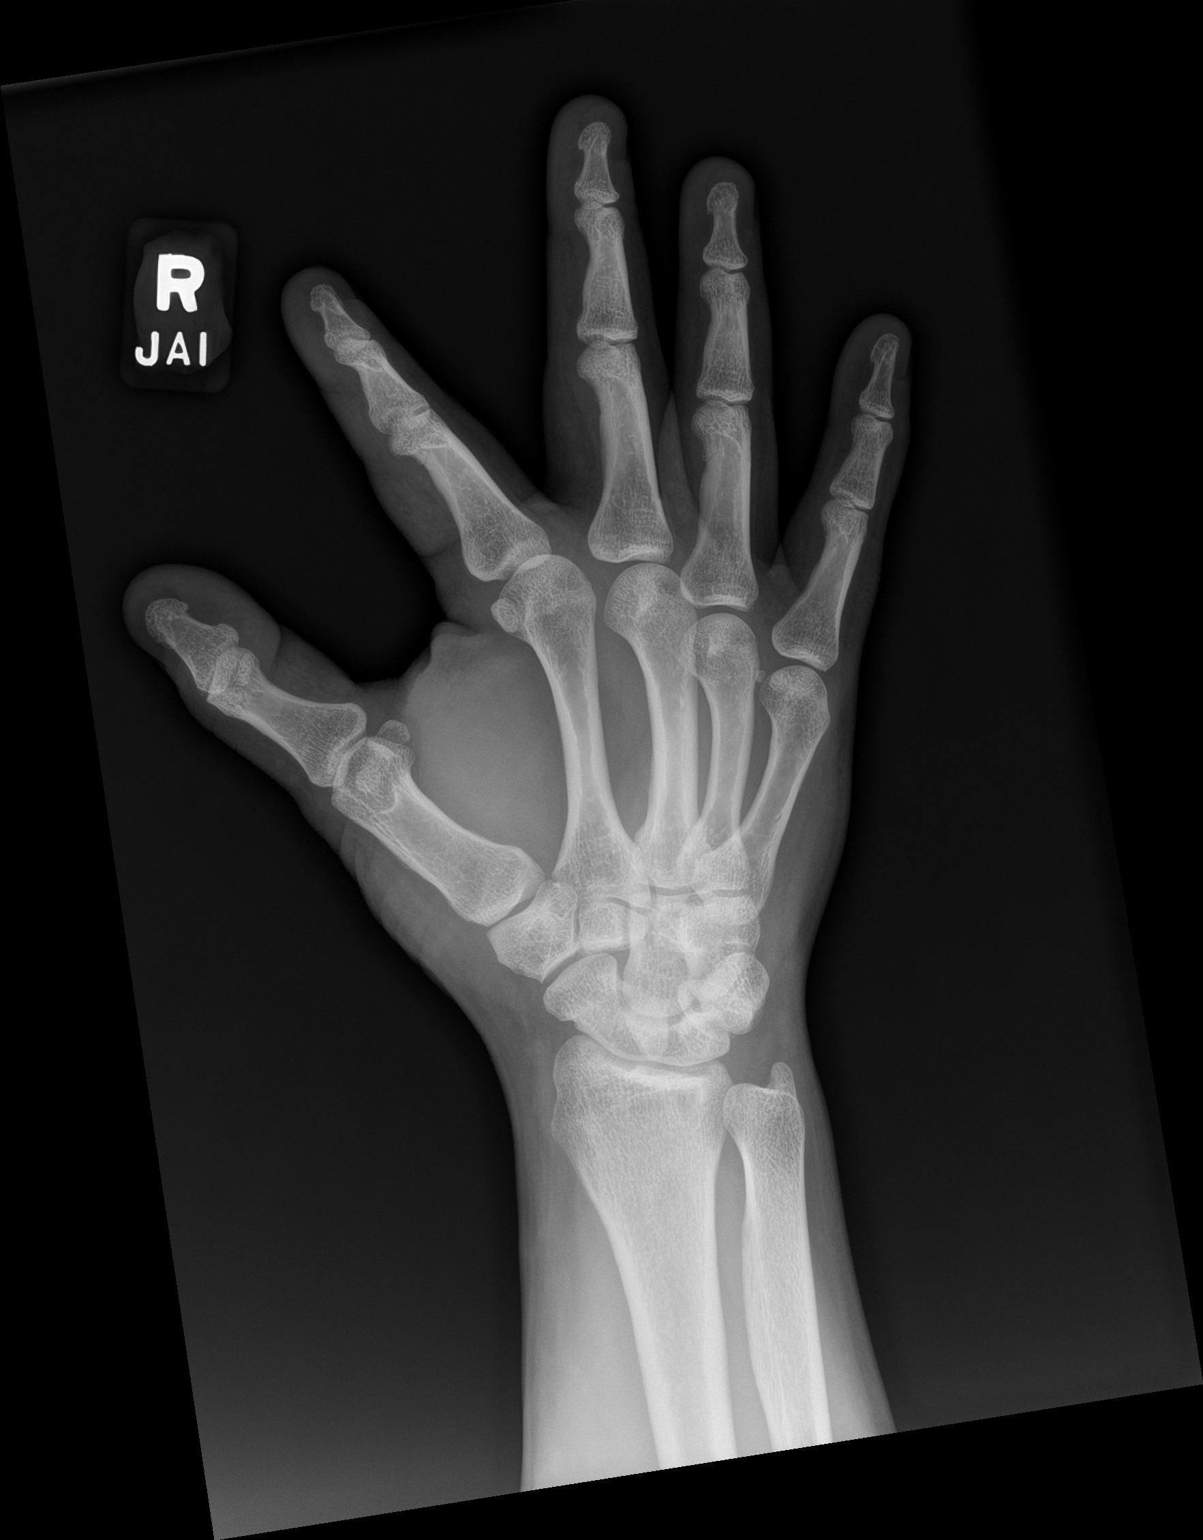

[hand lat]
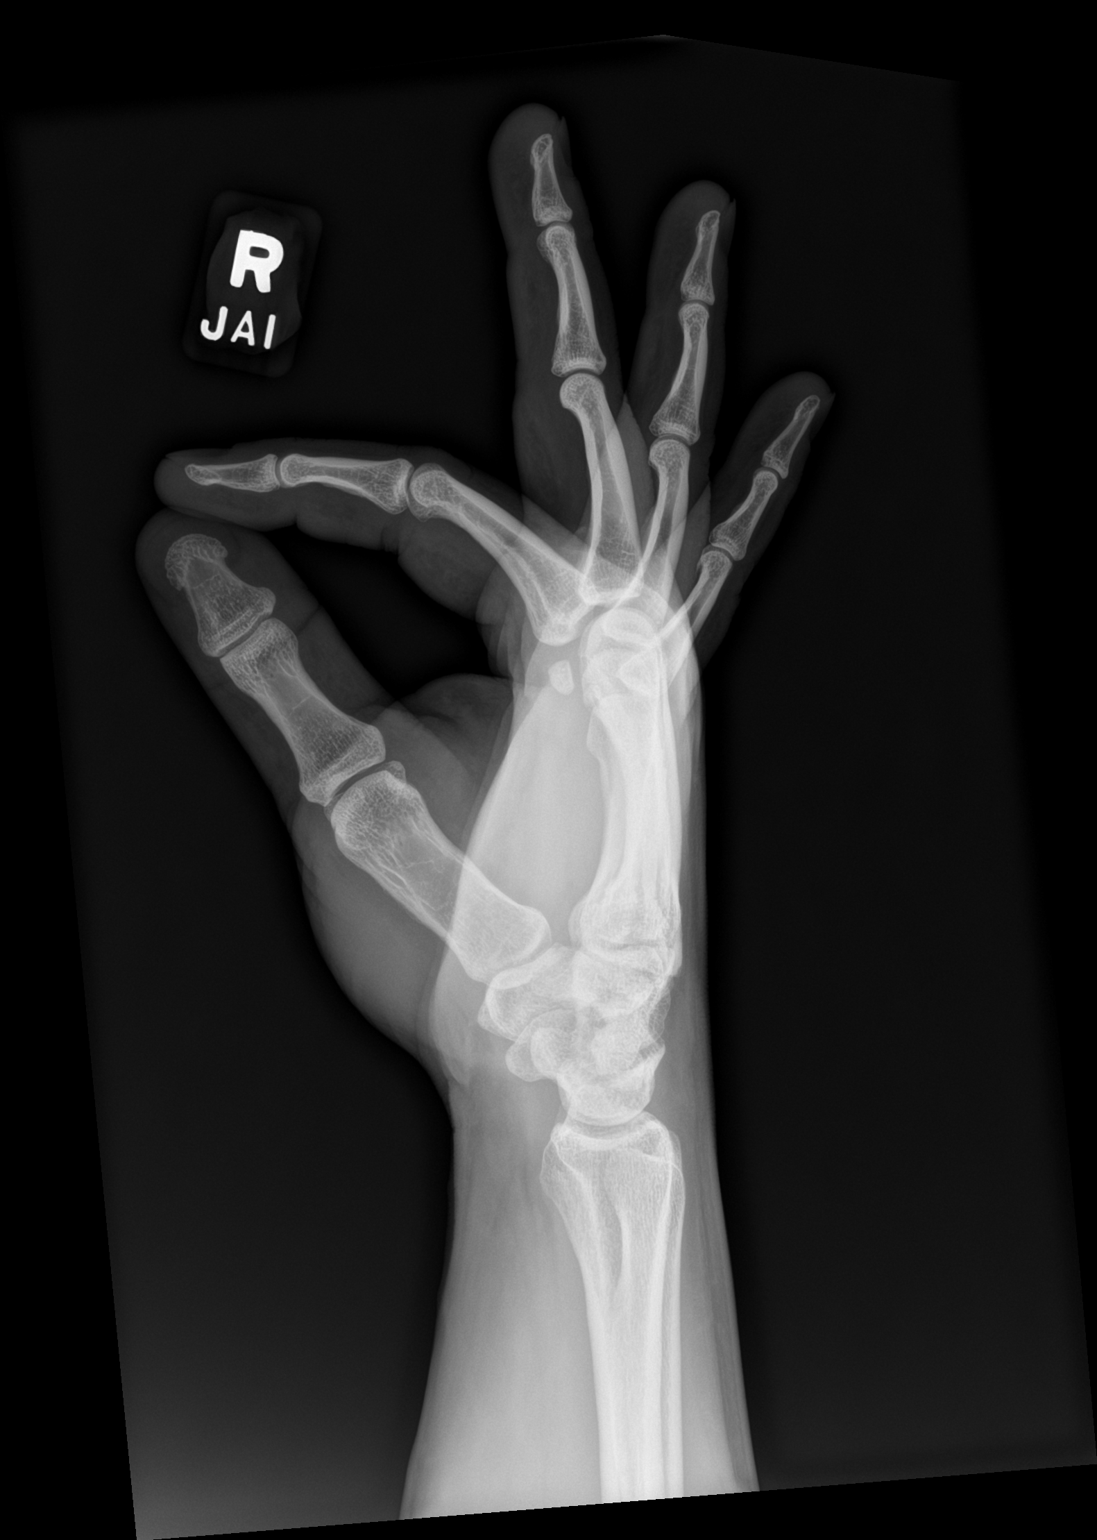

[3 of 3 positions shown; findings below may reference images not displayed]

FINDINGS: There is no evidence of fracture or dislocation. There is no
evidence of arthropathy or other focal bone abnormality. Soft
tissues are unremarkable.
IMPRESSION: No acute abnormality noted.

## 2024-03-18 IMAGING — DX DG PELVIS 1-2V
1 series · 1 of 1 positions shown · non-contrast
Comparison: None Available.

CLINICAL DATA: Bilateral anterior hip pain

EXAM:
PELVIS - 1-2 VIEW

[pelvis ap]
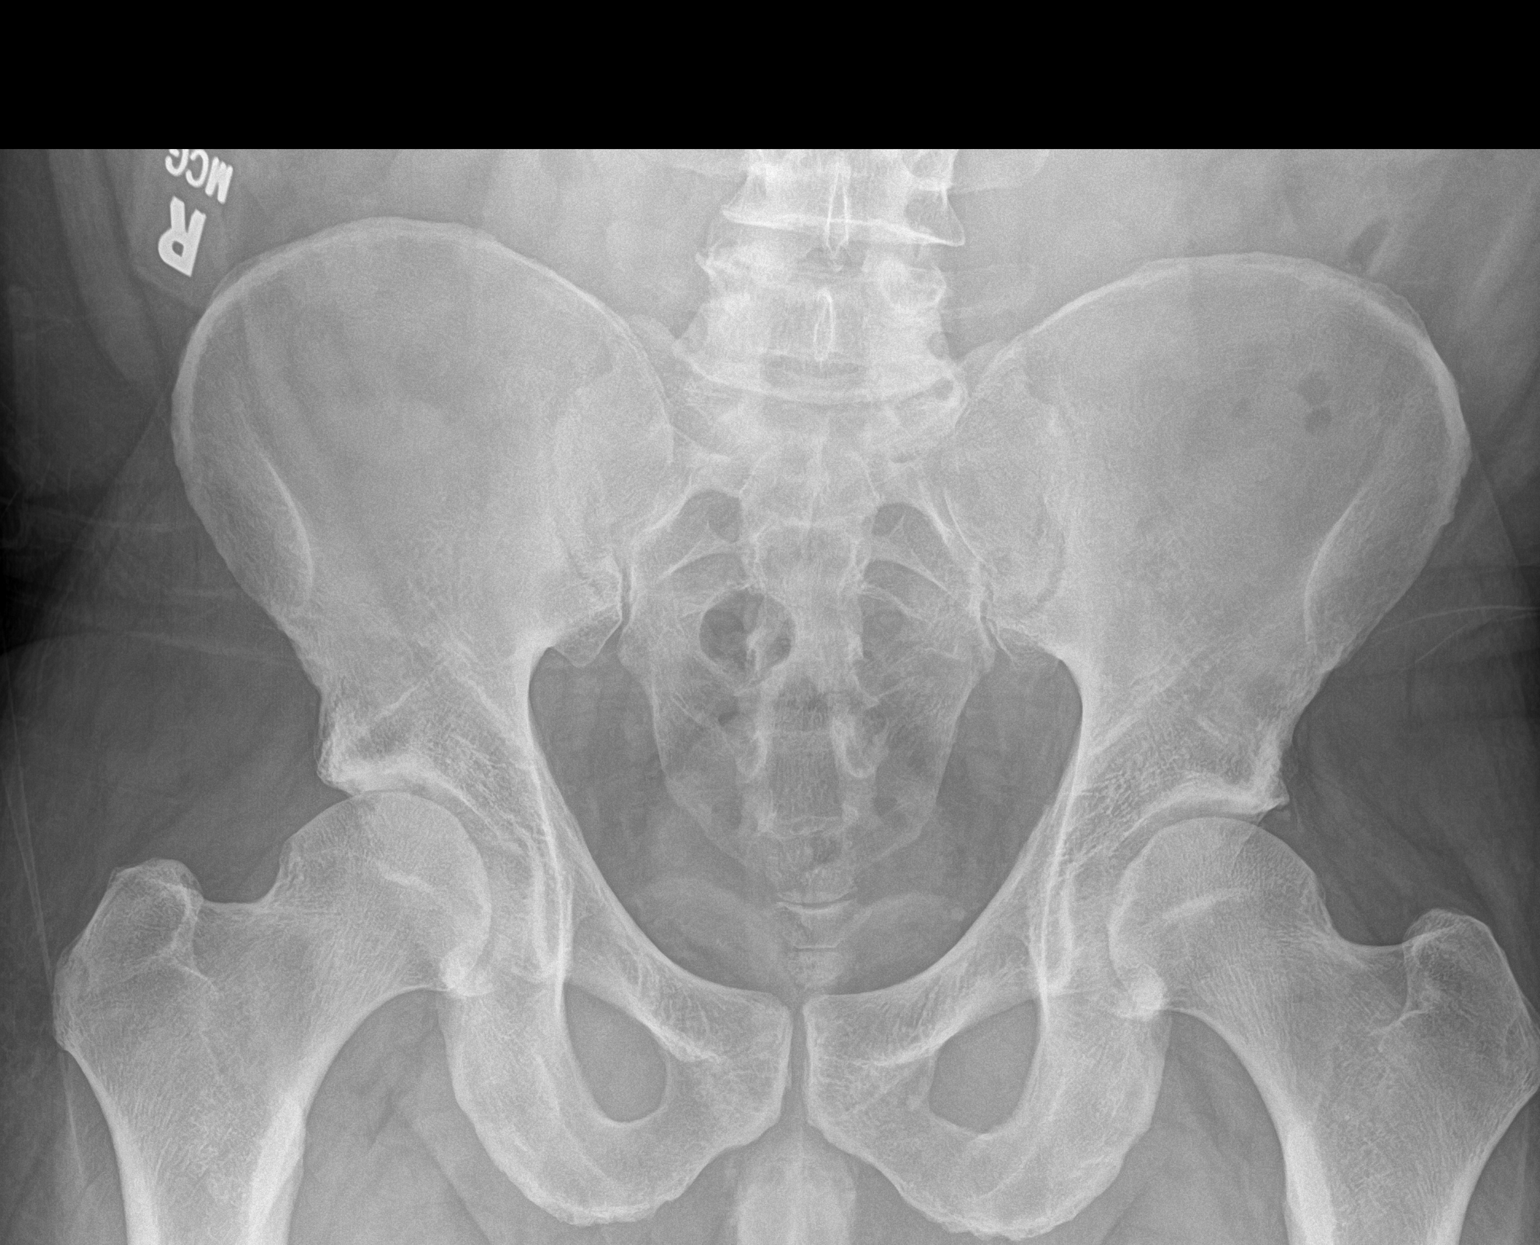

[1 of 1 positions shown; findings below may reference images not displayed]

FINDINGS: There is no evidence of pelvic fracture or diastasis. No pelvic bone
lesions are seen.
IMPRESSION: Negative.
